# Patient Record
Sex: Female | Born: 1990 | Race: White | Hispanic: No | Marital: Single | Smoking: Current every day smoker
Health system: Southern US, Community
[De-identification: ages and names within clinical notes are randomized; demographics above are authoritative.]

## PROBLEM LIST (undated history)

## (undated) ENCOUNTER — Inpatient Hospital Stay (HOSPITAL_COMMUNITY): Payer: Self-pay

## (undated) DIAGNOSIS — I10 Essential (primary) hypertension: Secondary | ICD-10-CM

## (undated) DIAGNOSIS — F419 Anxiety disorder, unspecified: Secondary | ICD-10-CM

## (undated) DIAGNOSIS — F32A Depression, unspecified: Secondary | ICD-10-CM

## (undated) DIAGNOSIS — F329 Major depressive disorder, single episode, unspecified: Secondary | ICD-10-CM

## (undated) DIAGNOSIS — L732 Hidradenitis suppurativa: Secondary | ICD-10-CM

## (undated) HISTORY — PX: TONSILLECTOMY: SUR1361

## (undated) HISTORY — DX: Hidradenitis suppurativa: L73.2

## (undated) HISTORY — PX: URETERAL EXPLORATION: SHX2609

## (undated) HISTORY — PX: OTHER SURGICAL HISTORY: SHX169

---
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
## 2009-03-22 ENCOUNTER — Encounter: Admission: RE | Admit: 2009-03-22 | Discharge: 2009-03-22 | Payer: Self-pay | Admitting: Internal Medicine

## 2009-07-06 ENCOUNTER — Emergency Department: Payer: Self-pay | Admitting: Emergency Medicine

## 2009-12-09 ENCOUNTER — Emergency Department: Payer: Self-pay | Admitting: Emergency Medicine

## 2010-01-30 ENCOUNTER — Ambulatory Visit: Payer: Self-pay | Admitting: Obstetrics & Gynecology

## 2010-01-30 LAB — CONVERTED CEMR LAB
Chlamydia, Swab/Urine, PCR: NEGATIVE
GC Probe Amp, Urine: NEGATIVE
HCV Ab: NEGATIVE
Testosterone: 27.13 ng/dL (ref 15–40)

## 2010-02-19 ENCOUNTER — Ambulatory Visit: Payer: Self-pay | Admitting: Obstetrics & Gynecology

## 2010-10-08 NOTE — Assessment & Plan Note (Signed)
NAME:  Sara Vargas, Sara Vargas NO.:  [id number]   MEDICAL RECORD NO.:  [id number]          PATIENT TYPE:  POB   LOCATION:  [facility] at [facility]         FACILITY:  [facility]   PHYSICIAN:  Jaynie Collins, MD     DATE OF BIRTH:  March 02, 1991   DATE OF SERVICE:  01/30/2010                                  CLINIC NOTE   REASON FOR VISIT:  The patient is requesting sexually transmitted  infection screening and birth control options.   HISTORY OF PRESENT ILLNESS:  The patient is a 20 year old gravida 0 with  a history of hypertension, who is here today requesting sexually  transmitted infection testing and also to discuss birth control options.  The patient is sexually active and has had 3 partners within the last  year.  She does not know of any exposure to any sexually transmitted  infections but she has had a history of gonorrhea in the past and just  wants to make sure that everything is okay.  The patient also has a  history of irregular menstrual periods and has been irregular since  menarche and has been told that she has ovarian cyst, but otherwise no  gynecologic problems.  She uses condoms for birth control currently and  wants to discuss being on hormonal options.  She has no other  gynecologic concerns.   PAST GYNECOLOGIC HISTORY:  She has never had a Pap smear.  She has been  told she had an ovarian cyst in the past.  She has had gonorrhea in the  past that was treated.  The patient does have irregular menstrual  periods and is using condoms for contraception.   PAST MEDICAL HISTORY:  High blood pressure.   PAST SURGICAL HISTORY:  None.   MEDICATIONS:  Norvasc 5 mg daily.   ALLERGIES:  No known drug allergies.   SOCIAL HISTORY:  Patient works as a Lawyer.  She lives with her 2  roommates.  She smokes 1-pack a day and has smoked for 4 years.  She  drinks alcohol socially and does not take any drugs.   FAMILY HISTORY:  Significant for high blood pressure and heart  disease.   PHYSICAL EXAMINATION:  Deferred at this point, she has normal vital  signs as written in her physical chart and a blood pressure of 131/76,  weight 313 pounds, height 5 feet 5 inches.   ASSESSMENT AND PLAN:  The patient is a 20 year old morbidly obese smoker  who is here to discuss birth control options.  Given all her risk  factors, the patient was counseled regarding progestin only pill options  and emphasize Mirena IUD or the Depo-Provera.  The patient does not want  these options, but wants to be on a combined oral contraceptive pill.  She has used Yasmin in the past and wants a prescription for this.  Patient was told that there is an increased risk of stroke, blood clots,  and even death given her risk factors especially of morbid obesity and  smoking and her hypertension, but the patient wants to proceed with this  modality.  She was given a prescription for Yasmin and told to follow up  for any untoward symptoms of side effects as further workup for her  irregular menses, TSH, and a total testosterone will be checked, also a  pelvic ultrasound will also be checked.  She might also have polycystic  ovarian syndrome.  The patient does have a primary care physician and  hopefully would have been tested for diabetes, but we will follow this  up at her next visit.  As for her request, this test for sexually  transmitted infection, we get a urine, gonorrhea and Chlamydia.  The  patient also requested for pregnancy test which will be done for her  urine and a serum testing for hepatitis B and C, HIV.  At her next  visit, the patient will be reminded of the Gardasil vaccination, it is  likely she got this through her primary care physician, but we will  verify.  The patient was told to call or come back in for any further  gynecologic complaints.           ______________________________  Jaynie Collins, MD     UA/MEDQ  D:  01/30/2010  T:  01/30/2010  Job:  [id number]

## 2010-10-11 NOTE — Op Note (Signed)
NAMEMARGRET, MOAT               ACCOUNT NO.:  [id number]   MEDICAL RECORD NO.:  [id number]          PATIENT TYPE:  AMB   LOCATION:  SDS                          FACILITY:  [facility]   PHYSICIAN:  Doralee Albino. Carola Frost, M.D. DATE OF BIRTH:  09-17-1990   DATE OF PROCEDURE:  06/10/2005  DATE OF DISCHARGE:                                 OPERATIVE REPORT   ADDENDUM:  That dictation number is [phone].  I had an inadvertent  discontinuation at a continuing dictation.   We were able to obtain an excellent purchase in both the distal and the  proximal fragments. C-arm images confirmed anatomic reduction with  appropriate placement of hardware. The elbow could be taken through a full  range of motion without difficulty. The wounds were irrigated and closed in  a standard layered fashion using #0 Vicryl for the deep layer and triceps  repair; as well as a 2-0 Vicryl for the subcu, and staples for the skin.  Sterile gentle compressive dressing was applied, as well as a posterior and  lateral support splint.  She was then placed back into her sling and taken  to PACU in stable condition.   PROGNOSIS:  Desia should do quite well from this fracture repair given the  bony stability that we were able to achieve. She is at some risk for  paresthesias or neurapraxia, given the need to mobilize both the ulnar and  radial nerves during the procedure, although we were able to visualize the  nerve in its entirety throughout; and were able to see that it remained free  of injury. She will not require any formal DVT prophylaxis given her upper  extremity injury; however, because of her size and the possibility she may  be sedentary, I will ask her to take a baby aspirin daily. She will be  admitted overnight for pain control, and then discharged in the morning.      Doralee Albino. Carola Frost, M.D.  Electronically Signed     MHH/MEDQ  D:  06/10/2005  T:  06/10/2005  Job:  [id number]
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
## 2010-10-15 ENCOUNTER — Ambulatory Visit (INDEPENDENT_AMBULATORY_CARE_PROVIDER_SITE_OTHER): Payer: Private Health Insurance - Indemnity | Admitting: Obstetrics & Gynecology

## 2010-10-15 DIAGNOSIS — E282 Polycystic ovarian syndrome: Secondary | ICD-10-CM

## 2010-10-17 NOTE — Assessment & Plan Note (Signed)
NAME:  Sara Vargas, Sara Vargas NO.:  [id number]  MEDICAL RECORD NO.:  [id number]          PATIENT TYPE:  LOCATION:  [facility] at [facility]           FACILITY:  PHYSICIAN:  Allie Bossier, MD        DATE OF BIRTH:  [birth year]  DATE OF SERVICE:  10/15/2010                                 CLINIC NOTE  [name] is a 20 year old single, white, gravida 0, who is having unprotected intercourse.  She comes in today for complaints of a "boil" on her mons.  She says this has been in for at least a week and getting larger.  She says she typically gets these boils in her vulvar area. They usually go away in less than a week and this one is not.  Of note, she tells me that she continues to have irregular/almost no.  When she was seen by Dr. Macon Large in September 2011, she was given a prescription for Yasmin per the patient request, although Dr. Macon Large wrote that she preferred a progestogen-only methods (as would I).  However, the patient did not take the Yasmin and she uses condoms on an irregular basis, because of this I did a pregnancy test today and she said "I think I might be pregnant."  Her pregnancy test today is negative.  1. 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 b.i.d. #20. 2. Next issue is birth control.  I have given her handout on Mirena     and strongly recommended that she use the Mirena not only for birth     control, but also to help prevent uterine hyperplasia/uterine     cancer due to her history of oligomenorrhea. 3. Due to her morbid obesity and probable PCOS,  I am having her come     back next week for fasting blood (lipids and sugar).     Allie Bossier, MD    MCD/MEDQ  D:  10/15/2010  T:  10/16/2010  Job:  [id number]

## 2014-06-24 ENCOUNTER — Encounter (HOSPITAL_COMMUNITY): Payer: Self-pay | Admitting: Cardiology

## 2014-06-24 ENCOUNTER — Emergency Department (HOSPITAL_COMMUNITY): Payer: Private Health Insurance - Indemnity

## 2014-06-24 ENCOUNTER — Emergency Department (HOSPITAL_COMMUNITY)
Admission: EM | Admit: 2014-06-24 | Discharge: 2014-06-24 | Disposition: A | Discharge status: Home / Self Care | Payer: Private Health Insurance - Indemnity | Attending: Emergency Medicine | Admitting: Emergency Medicine

## 2014-06-24 DIAGNOSIS — Z79899 Other long term (current) drug therapy: Secondary | ICD-10-CM | POA: Insufficient documentation

## 2014-06-24 DIAGNOSIS — R079 Chest pain, unspecified: Secondary | ICD-10-CM

## 2014-06-24 DIAGNOSIS — I10 Essential (primary) hypertension: Secondary | ICD-10-CM | POA: Insufficient documentation

## 2014-06-24 DIAGNOSIS — Z720 Tobacco use: Secondary | ICD-10-CM | POA: Insufficient documentation

## 2014-06-24 HISTORY — DX: Essential (primary) hypertension: I10

## 2014-06-24 LAB — CBC WITH DIFFERENTIAL/PLATELET
Basophils Absolute: 0 10*3/uL (ref 0.0–0.1)
Basophils Relative: 0 % (ref 0–1)
EOS PCT: 1 % (ref 0–5)
Eosinophils Absolute: 0.1 10*3/uL (ref 0.0–0.7)
HEMATOCRIT: 41.8 % (ref 36.0–46.0)
HEMOGLOBIN: 14 g/dL (ref 12.0–15.0)
LYMPHS ABS: 2.6 10*3/uL (ref 0.7–4.0)
LYMPHS PCT: 38 % (ref 12–46)
MCH: 28.9 pg (ref 26.0–34.0)
MCHC: 33.5 g/dL (ref 30.0–36.0)
MCV: 86.2 fL (ref 78.0–100.0)
MONO ABS: 0.5 10*3/uL (ref 0.1–1.0)
MONOS PCT: 7 % (ref 3–12)
NEUTROS ABS: 3.7 10*3/uL (ref 1.7–7.7)
NEUTROS PCT: 54 % (ref 43–77)
PLATELETS: 241 10*3/uL (ref 150–400)
RBC: 4.85 MIL/uL (ref 3.87–5.11)
RDW: 13.3 % (ref 11.5–15.5)
WBC: 6.9 10*3/uL (ref 4.0–10.5)

## 2014-06-24 LAB — COMPREHENSIVE METABOLIC PANEL
ALBUMIN: 3.8 g/dL (ref 3.5–5.2)
ALT: 18 U/L (ref 0–35)
ANION GAP: 5 (ref 5–15)
AST: 18 U/L (ref 0–37)
Alkaline Phosphatase: 92 U/L (ref 39–117)
BILIRUBIN TOTAL: 1.2 mg/dL (ref 0.3–1.2)
BUN: 9 mg/dL (ref 6–23)
CALCIUM: 9.2 mg/dL (ref 8.4–10.5)
CHLORIDE: 111 mmol/L (ref 96–112)
CO2: 27 mmol/L (ref 19–32)
Creatinine, Ser: 0.88 mg/dL (ref 0.50–1.10)
Glucose, Bld: 86 mg/dL (ref 70–99)
Potassium: 3.5 mmol/L (ref 3.5–5.1)
Sodium: 143 mmol/L (ref 135–145)
Total Protein: 6.3 g/dL (ref 6.0–8.3)

## 2014-06-24 LAB — I-STAT TROPONIN, ED: TROPONIN I, POC: 0 ng/mL (ref 0.00–0.08)

## 2014-06-24 MED ORDER — ALBUTEROL SULFATE (2.5 MG/3ML) 0.083% IN NEBU
5.0000 mg | INHALATION_SOLUTION | Freq: Once | RESPIRATORY_TRACT | Status: AC
  Administered 2014-06-24: 5 mg via RESPIRATORY_TRACT
  Filled 2014-06-24: qty 6

## 2014-06-24 NOTE — ED Notes (Signed)
Patient was not in room when I went to round on patient.  Monitoring equipment not in place.  Looked around department for patient. Unable to locate.  Family member has left as well, assumed patient has left AMA.

## 2014-06-24 NOTE — ED Notes (Signed)
Pt reports chest pain and HTN for the past couple of days. States that she has a hx of HTN and has not been taking her medications. Reports some SOB.

## 2014-06-24 NOTE — ED Notes (Signed)
Dr. Bebe ShaggyWickline informed that patient has left AMA.

## 2014-06-24 NOTE — ED Notes (Signed)
Anju, PA at bedside  

## 2014-06-24 NOTE — ED Provider Notes (Signed)
CSN: [id number]     Arrival date & time 06/24/14  1446 History   First MD Initiated Contact with Patient 06/24/14 1549     Chief Complaint  Patient presents with  . Chest Pain     (Consider location/radiation/quality/duration/timing/severity/associated sxs/prior Treatment) Patient is a 24 y.o. female presenting with chest pain. The history is provided by the patient and medical records. No language interpreter was used.  Chest Pain Associated symptoms: no abdominal pain, no back pain, no cough, no diaphoresis, no fatigue, no fever, no headache, no nausea, no shortness of breath and not vomiting     Sara Vargas is a 24 y.o. female  with a hx of HTN, bronchospasm with intermittent albuterol inhaler usage presents to the Emergency Department complaining of gradual, persistent, progressively improving left chest "tightness" onset 1pm.  Pt without personal cardiac Hx; Mother with hx of CAD.  Pt describes chest pain as left-sided chest tightness similar to previous bronchospasm episodes. She has not tried any treatments including her inhaler prior to arrival. She denies shortness of breath, nausea, diaphoresis, abdominal pain, vomiting, weakness, syncope, near syncope, dysuria, hematuria. Patient reports that she is supposed to be taking Norvasc for her hypertension has not taken it in approximately 3 months due to financial concerns. She reports that while lifting and pulling at work today she began to feel the left-sided chest tightness and slightly short of breath. With her coworker checked her blood pressure she reports it was 160 systolic. She reports this is what prompted her to come to the emergency department. She reports that this time she feels much better, her chest pain is decreasing and her shortness of breath has resolved however the chest tightness persists. No aggravating or alleviating factors. Movement and palpation do not make her discomfort worse.     Past Medical History   Diagnosis Date  . Hypertension    Past Surgical History  Procedure Laterality Date  . Tonsillectomy    . Arm surgery     History reviewed. No pertinent family history. History  Substance Use Topics  . Smoking status: Current Every Day Smoker -- 1.00 packs/day    Types: Cigarettes  . Smokeless tobacco: Not on file  . Alcohol Use: No   OB History    No data available     Review of Systems  Constitutional: Negative for fever, diaphoresis, appetite change, fatigue and unexpected weight change.  HENT: Negative for mouth sores.   Eyes: Negative for visual disturbance.  Respiratory: Positive for chest tightness. Negative for cough, shortness of breath and wheezing.   Cardiovascular: Positive for chest pain.  Gastrointestinal: Negative for nausea, vomiting, abdominal pain, diarrhea and constipation.  Endocrine: Negative for polydipsia, polyphagia and polyuria.  Genitourinary: Negative for dysuria, urgency, frequency and hematuria.  Musculoskeletal: Negative for back pain and neck stiffness.  Skin: Negative for rash.  Allergic/Immunologic: Negative for immunocompromised state.  Neurological: Negative for syncope, light-headedness and headaches.  Hematological: Does not bruise/bleed easily.  Psychiatric/Behavioral: Negative for sleep disturbance. The patient is not nervous/anxious.       Allergies  Review of patient's allergies indicates no known allergies.  Home Medications   Prior to Admission medications   Not on File   BP 105/43 mmHg  Pulse 82  Temp(Src) 97.7 F (36.5 C) (Oral)  Resp 22  Ht  (1.651 m)  Wt 314 lb (142.429 kg)  BMI 52.25 kg/m2  SpO2 98%  LMP 06/03/2014 Physical Exam  Constitutional: She appears well-developed and well-nourished.  No distress.  Awake, alert, nontoxic appearance  HENT:  Head: Normocephalic and atraumatic.  Mouth/Throat: Oropharynx is clear and moist. No oropharyngeal exudate.  Eyes: Conjunctivae are normal. No scleral  icterus.  Neck: Normal range of motion. Neck supple.  Cardiovascular: Normal rate, regular rhythm, normal heart sounds and intact distal pulses.   No murmur heard. No tachycardia  Pulmonary/Chest: Effort normal. No accessory muscle usage. No tachypnea. No respiratory distress. She has decreased breath sounds (throughout). She has no wheezes. She has no rhonchi. She has no rales. She exhibits no tenderness.  Equal chest expansion  Abdominal: Soft. Bowel sounds are normal. She exhibits no mass. There is no tenderness. There is no rebound and no guarding.  Musculoskeletal: Normal range of motion. She exhibits no edema.  Neurological: She is alert. She exhibits normal muscle tone. Coordination normal.  Speech is clear and goal oriented Moves extremities without ataxia  Skin: Skin is warm and dry. She is not diaphoretic. No erythema.  Psychiatric: She has a normal mood and affect.  Nursing note and vitals reviewed.   ED Course  Procedures (including critical care time) Labs Review Labs Reviewed  CBC WITH DIFFERENTIAL/PLATELET  COMPREHENSIVE METABOLIC PANEL  URINALYSIS, ROUTINE W REFLEX MICROSCOPIC  I-STAT TROPOININ, ED  POC URINE PREG, ED    Imaging Review Dg Chest 2 View  06/24/2014   CLINICAL DATA:  Chest pain and difficulty breathing  EXAM: CHEST  2 VIEW  COMPARISON:  August 21, 2009  FINDINGS: Lungs are clear. Heart size and pulmonary vascularity are normal. No adenopathy. No pneumothorax. No bone lesions.  IMPRESSION: No edema or consolidation.   Electronically Signed   By: Bretta Bang M.D.   On: 06/24/2014 17:09     EKG Interpretation   Date/Time:  Saturday June 24 2014 14:52:25 EST Ventricular Rate:  96 PR Interval:  116 QRS Duration: 90 QT Interval:  332 QTC Calculation: 419 R Axis:   66 Text Interpretation:  Normal sinus rhythm ST abnormality, possible  digitalis effect Abnormal ECG No significant change since last tracing  Confirmed by Bebe Shaggy  MD, DONALD  [phone]) on 06/24/2014 4:13:29 PM      MDM   Final diagnoses:  Chest pain   [name] presents with chest pain and concerns for HTN.  Pt is not hypertensive here in the ED. she complains of chest tightness similar to previous issues with bronchospasm, no wheezing heard. Will give albuterol and reassess. Labs and chest x-ray pending.   5:59 PM Patient is to be discharged with recommendation to follow up with PCP in regards to today's hospital visit. Chest pain is not likely of cardiac or pulmonary etiology d/t presentation, PERC negative, VSS, no tracheal deviation, no JVD or new murmur, RRR, breath sounds equal bilaterally, EKG without acute abnormalities, negative troponin, and negative CXR. Patient is chest pain-free at this time. She reports that the albuterol helped a little bit as her chest does not feel as tight. Patient requests discharge home at this time. Pt has been advised to return to the ED if CP becomes exertional, associated with diaphoresis or nausea, radiates to left jaw/arm, worsens or becomes concerning in any way. Pt appears reliable for follow up and is agreeable to discharge.   I have personally reviewed patient's vitals, nursing note and any pertinent labs or imaging.  I performed an undressed physical exam.    It has been determined that no acute conditions requiring further emergency intervention are present at this time. The patient/guardian  have been advised of the diagnosis and plan. I reviewed all labs and imaging including any potential incidental findings. We have discussed signs and symptoms that warrant return to the ED and they are listed in the discharge instructions.    Vital signs are stable at discharge.   BP 105/43 mmHg  Pulse 82  Temp(Src) 97.7 F (36.5 C) (Oral)  Resp 22  Ht 5\' 5"  (1.651 m)  Wt 314 lb (142.429 kg)  BMI 52.25 kg/m2  SpO2 98%  LMP 06/03/2014           Dierdre ForthHannah Heaton Sarin, PA-C 06/24/14 1826  Joya Gaskinsonald W Wickline,  MD 06/24/14 [phone]

## 2014-06-24 NOTE — Discharge Instructions (Signed)
1. Medications: usual home medications 2. Treatment: rest, drink plenty of fluids,  3. Follow Up: [facility] for discussion of your diagnoses and further evaluation after today's visit; if you do not have a primary care doctor use the resource guide provided to find one; Please return to the ER for return of CP, SOB, passing out or other symptoms.   Chest Pain (Nonspecific) It is often hard to give a diagnosis for the cause of chest pain. There is always a chance that your pain could be related to something serious, such as a heart attack or a blood clot in the lungs. You need to follow up with your doctor. HOME CARE  If antibiotic medicine was given, take it as directed by your doctor. Finish the medicine even if you start to feel better.  For the next few days, avoid activities that bring on chest pain. Continue physical activities as told by your doctor.  Do not use any tobacco products. This includes cigarettes, chewing tobacco, and e-cigarettes.  Avoid drinking alcohol.  Only take medicine as told by your doctor.  Follow your doctor's suggestions for more testing if your chest pain does not go away.  Keep all doctor visits you made. GET HELP IF:  Your chest pain does not go away, even after treatment.  You have a rash with blisters on your chest.  You have a fever. GET HELP RIGHT AWAY IF:   You have more pain or pain that spreads to your arm, neck, jaw, back, or belly (abdomen).  You have shortness of breath.  You cough more than usual or cough up blood.  You have very bad back or belly pain.  You feel sick to your stomach (nauseous) or throw up (vomit).  You have very bad weakness.  You pass out (faint).  You have chills. This is an emergency. Do not wait to see if the problems will go away. Call your local emergency services (911 in [place] Do not drive yourself to the hospital. MAKE SURE YOU:   Understand these  instructions.  Will watch your condition.  Will get help right away if you are not doing well or get worse. Document Released: 10/29/2007 Document Revised: 05/17/2013 Document Reviewed: 10/29/2007 [facility] Patient Information 2015 [employer] [place]. This information is not intended to replace advice given to you by your health care provider. Make sure you discuss any questions you have with your health care provider.

## 2014-08-14 ENCOUNTER — Encounter (HOSPITAL_COMMUNITY): Payer: Self-pay | Admitting: Emergency Medicine

## 2014-08-14 ENCOUNTER — Emergency Department (HOSPITAL_COMMUNITY)
Admission: EM | Admit: 2014-08-14 | Discharge: 2014-08-14 | Disposition: A | Discharge status: Home / Self Care | Payer: Private Health Insurance - Indemnity | Attending: Emergency Medicine | Admitting: Emergency Medicine

## 2014-08-14 DIAGNOSIS — N390 Urinary tract infection, site not specified: Secondary | ICD-10-CM | POA: Insufficient documentation

## 2014-08-14 DIAGNOSIS — Z720 Tobacco use: Secondary | ICD-10-CM | POA: Insufficient documentation

## 2014-08-14 DIAGNOSIS — I10 Essential (primary) hypertension: Secondary | ICD-10-CM | POA: Insufficient documentation

## 2014-08-14 LAB — URINE MICROSCOPIC-ADD ON

## 2014-08-14 LAB — URINALYSIS, ROUTINE W REFLEX MICROSCOPIC
Bilirubin Urine: NEGATIVE
GLUCOSE, UA: NEGATIVE mg/dL
KETONES UR: NEGATIVE mg/dL
NITRITE: POSITIVE — AB
PROTEIN: NEGATIVE mg/dL
Specific Gravity, Urine: 1.006 (ref 1.005–1.030)
UROBILINOGEN UA: 1 mg/dL (ref 0.0–1.0)
pH: 5.5 (ref 5.0–8.0)

## 2014-08-14 MED ORDER — HYDROCODONE-ACETAMINOPHEN 5-325 MG PO TABS: 1.0000 | ORAL_TABLET | ORAL | Status: DC | PRN

## 2014-08-14 MED ORDER — CEPHALEXIN 500 MG PO CAPS: 500.0000 mg | ORAL_CAPSULE | Freq: Four times a day (QID) | ORAL | Status: DC

## 2014-08-14 NOTE — ED Notes (Signed)
Pt states having lower back pain, diagnosed w/ lower UTI, to give discharge papers

## 2014-08-14 NOTE — ED Provider Notes (Signed)
CSN: [id number]     Arrival date & time 08/14/14  1653 History   First MD Initiated Contact with Patient 08/14/14 2013     Chief Complaint  Patient presents with  . Dysuria     (Consider location/radiation/quality/duration/timing/severity/associated sxs/prior Treatment) HPI Comments: Patient here complaining of dysuria without vaginal bleeding or discharge and some right-sided flank pain 2 days. She had a negative pregnancy test 2 days ago according to her as well. Has been using over-the-counter medications without relief. History of UTI in the past and this is similar. Denies any vomiting or diarrhea. No rashes to her flank area. Nothing makes her symptoms better  Patient is a 24 y.o. female presenting with dysuria. The history is provided by the patient.  Dysuria   Past Medical History  Diagnosis Date  . Hypertension    Past Surgical History  Procedure Laterality Date  . Tonsillectomy    . Arm surgery    . Ureteral exploration     No family history on file. History  Substance Use Topics  . Smoking status: Current Every Day Smoker -- 1.00 packs/day    Types: Cigarettes  . Smokeless tobacco: Not on file  . Alcohol Use: No   OB History    No data available     Review of Systems  Genitourinary: Positive for dysuria.  All other systems reviewed and are negative.     Allergies  Review of patient's allergies indicates no known allergies.  Home Medications   Prior to Admission medications   Medication Sig Start Date End Date Taking? Authorizing Provider  cephALEXin (KEFLEX) 500 MG capsule Take 1 capsule (500 mg total) by mouth 4 (four) times daily. 08/14/14   Lorre NickAnthony Ercell Perlman, MD  HYDROcodone-acetaminophen (NORCO/VICODIN) 5-325 MG per tablet Take 1-2 tablets by mouth every 4 (four) hours as needed. 08/14/14   Lorre NickAnthony Miyana Mordecai, MD   BP 145/73 mmHg  Pulse 89  Temp(Src) 98.2 F (36.8 C) (Oral)  Resp 18  SpO2 100%  LMP 07/08/2014 Physical Exam  Constitutional: She is  oriented to person, place, and time. She appears well-developed and well-nourished.  Non-toxic appearance. No distress.  HENT:  Head: Normocephalic and atraumatic.  Eyes: Conjunctivae, EOM and lids are normal. Pupils are equal, round, and reactive to light.  Neck: Normal range of motion. Neck supple. No tracheal deviation present. No thyroid mass present.  Cardiovascular: Normal rate, regular rhythm and normal heart sounds.  Exam reveals no gallop.   No murmur heard. Pulmonary/Chest: Effort normal and breath sounds normal. No stridor. No respiratory distress. She has no decreased breath sounds. She has no wheezes. She has no rhonchi. She has no rales.  Abdominal: Soft. Normal appearance and bowel sounds are normal. She exhibits no distension. There is tenderness. There is no rigidity, no rebound, no guarding and no CVA tenderness.  Musculoskeletal: Normal range of motion. She exhibits no edema or tenderness.  Neurological: She is alert and oriented to person, place, and time. She has normal strength. No cranial nerve deficit or sensory deficit. GCS eye subscore is 4. GCS verbal subscore is 5. GCS motor subscore is 6.  Skin: Skin is warm and dry. No abrasion and no rash noted.  Psychiatric: She has a normal mood and affect. Her speech is normal and behavior is normal.  Nursing note and vitals reviewed.   ED Course  Procedures (including critical care time) Labs Review Labs Reviewed  URINALYSIS, ROUTINE W REFLEX MICROSCOPIC - Abnormal; Notable for the following:  Color, Urine ORANGE (*)    APPearance CLOUDY (*)    Hgb urine dipstick MODERATE (*)    Nitrite POSITIVE (*)    Leukocytes, UA MODERATE (*)    All other components within normal limits  URINE MICROSCOPIC-ADD ON - Abnormal; Notable for the following:    Squamous Epithelial / LPF FEW (*)    Bacteria, UA MANY (*)    All other components within normal limits    Imaging Review No results found.   EKG Interpretation None       MDM   Final diagnoses:  UTI (lower urinary tract infection)    Patient to be treated for UTI    Lorre Nick, MD 08/14/14 2024

## 2014-08-14 NOTE — ED Notes (Signed)
Pt states she has had painful urination and R sided back pain since Saturday. Alert and oriented.

## 2014-08-14 NOTE — Discharge Instructions (Signed)

## 2015-01-08 ENCOUNTER — Other Ambulatory Visit: Payer: Self-pay | Admitting: Obstetrics and Gynecology

## 2015-01-08 DIAGNOSIS — O3680X0 Pregnancy with inconclusive fetal viability, not applicable or unspecified: Secondary | ICD-10-CM

## 2015-01-09 ENCOUNTER — Other Ambulatory Visit: Payer: Self-pay | Admitting: Obstetrics and Gynecology

## 2015-01-09 ENCOUNTER — Ambulatory Visit (INDEPENDENT_AMBULATORY_CARE_PROVIDER_SITE_OTHER): Payer: Medicaid Other

## 2015-01-09 DIAGNOSIS — O3680X0 Pregnancy with inconclusive fetal viability, not applicable or unspecified: Secondary | ICD-10-CM

## 2015-01-09 NOTE — Progress Notes (Signed)
[place] 6+1wks single IUP w/ys,pos fht 116bpm,normal ov's bilat,crl 4.23mm,edd 09/03/2015

## 2015-01-18 ENCOUNTER — Ambulatory Visit (INDEPENDENT_AMBULATORY_CARE_PROVIDER_SITE_OTHER): Payer: Medicaid Other | Admitting: Advanced Practice Midwife

## 2015-01-18 ENCOUNTER — Encounter: Payer: Self-pay | Admitting: Advanced Practice Midwife

## 2015-01-18 VITALS — BP 108/72 | HR 64 | Wt 323.0 lb

## 2015-01-18 DIAGNOSIS — O10919 Unspecified pre-existing hypertension complicating pregnancy, unspecified trimester: Secondary | ICD-10-CM | POA: Insufficient documentation

## 2015-01-18 DIAGNOSIS — Z0283 Encounter for blood-alcohol and blood-drug test: Secondary | ICD-10-CM

## 2015-01-18 DIAGNOSIS — Z3401 Encounter for supervision of normal first pregnancy, first trimester: Secondary | ICD-10-CM | POA: Diagnosis not present

## 2015-01-18 DIAGNOSIS — Z331 Pregnant state, incidental: Secondary | ICD-10-CM | POA: Diagnosis not present

## 2015-01-18 DIAGNOSIS — Z6841 Body Mass Index (BMI) 40.0 and over, adult: Secondary | ICD-10-CM

## 2015-01-18 DIAGNOSIS — O09891 Supervision of other high risk pregnancies, first trimester: Secondary | ICD-10-CM | POA: Diagnosis not present

## 2015-01-18 DIAGNOSIS — O09899 Supervision of other high risk pregnancies, unspecified trimester: Secondary | ICD-10-CM | POA: Insufficient documentation

## 2015-01-18 DIAGNOSIS — Z369 Encounter for antenatal screening, unspecified: Secondary | ICD-10-CM

## 2015-01-18 DIAGNOSIS — Z3682 Encounter for antenatal screening for nuchal translucency: Secondary | ICD-10-CM

## 2015-01-18 DIAGNOSIS — O10911 Unspecified pre-existing hypertension complicating pregnancy, first trimester: Secondary | ICD-10-CM

## 2015-01-18 DIAGNOSIS — Z1389 Encounter for screening for other disorder: Secondary | ICD-10-CM | POA: Diagnosis not present

## 2015-01-18 NOTE — Patient Instructions (Addendum)
Safe Medications in Pregnancy   Acne: Benzoyl Peroxide Salicylic Acid  Backache/Headache: Tylenol: 2 regular strength every 4 hours OR              2 Extra strength every 6 hours  Colds/Coughs/Allergies: Benadryl (alcohol free) 25 mg every 6 hours as needed Breath right strips Claritin Cepacol throat lozenges Chloraseptic throat spray Cold-Eeze- up to three times per day Cough drops, alcohol free Flonase (by prescription only) Guaifenesin Mucinex Robitussin DM (plain only, alcohol free) Saline nasal spray/drops Sudafed (pseudoephedrine) & Actifed ** use only after [redacted] weeks gestation and if you do not have high blood pressure Tylenol Vicks Vaporub Zinc lozenges Zyrtec   Constipation: Colace Ducolax suppositories Fleet enema Glycerin suppositories Metamucil Milk of magnesia Miralax Senokot Smooth move tea  Diarrhea: Kaopectate Imodium A-D  *NO pepto Bismol  Hemorrhoids: Anusol Anusol HC Preparation H Tucks  Indigestion: Tums Maalox Mylanta Zantac  Pepcid  Insomnia: Benadryl (alcohol free) 25mg every 6 hours as needed Tylenol PM Unisom, no Gelcaps  Leg Cramps: Tums MagGel  Nausea/Vomiting:  Bonine Dramamine Emetrol Ginger extract Sea bands Meclizine  Nausea medication to take during pregnancy:  Unisom (doxylamine succinate 25 mg tablets) Take one tablet daily at bedtime. If symptoms are not adequately controlled, the dose can be increased to a maximum recommended dose of two tablets daily (1/2 tablet in the morning, 1/2 tablet mid-afternoon and one at bedtime). Vitamin B6 100mg tablets. Take one tablet twice a day (up to 200 mg per day).  Skin Rashes: Aveeno products Benadryl cream or 25mg every 6 hours as needed Calamine Lotion 1% cortisone cream  Yeast infection: Gyne-lotrimin 7 Monistat 7   **If taking multiple medications, please check labels to avoid duplicating the same active ingredients **take medication as directed on  the label ** Do not exceed 4000 mg of tylenol in 24 hours **Do not take medications that contain aspirin or ibuprofen    First Trimester of Pregnancy The first trimester of pregnancy is from week 1 until the end of week 12 (months 1 through 3). A week after a sperm fertilizes an egg, the egg will implant on the wall of the uterus. This embryo will begin to develop into a baby. Genes from you and your partner are forming the baby. The female genes determine whether the baby is a boy or a girl. At 6-8 weeks, the eyes and face are formed, and the heartbeat can be seen on ultrasound. At the end of 12 weeks, all the baby's organs are formed.  Now that you are pregnant, you will want to do everything you can to have a healthy baby. Two of the most important things are to get good prenatal care and to follow your health care provider's instructions. Prenatal care is all the medical care you receive before the baby's birth. This care will help prevent, find, and treat any problems during the pregnancy and childbirth. BODY CHANGES Your body goes through many changes during pregnancy. The changes vary from woman to woman.   You may gain or lose a couple of pounds at first.  You may feel sick to your stomach (nauseous) and throw up (vomit). If the vomiting is uncontrollable, call your health care provider.  You may tire easily.  You may develop headaches that can be relieved by medicines approved by your health care provider.  You may urinate more often. Painful urination may mean you have a bladder infection.  You may develop heartburn as a result of your   pregnancy.  You may develop constipation because certain hormones are causing the muscles that push waste through your intestines to slow down.  You may develop hemorrhoids or swollen, bulging veins (varicose veins).  Your breasts may begin to grow larger and become tender. Your nipples may stick out more, and the tissue that surrounds them (areola)  may become darker.  Your gums may bleed and may be sensitive to brushing and flossing.  Dark spots or blotches (chloasma, mask of pregnancy) may develop on your face. This will likely fade after the baby is born.  Your menstrual periods will stop.  You may have a loss of appetite.  You may develop cravings for certain kinds of food.  You may have changes in your emotions from day to day, such as being excited to be pregnant or being concerned that something may go wrong with the pregnancy and baby.  You may have more vivid and strange dreams.  You may have changes in your hair. These can include thickening of your hair, rapid growth, and changes in texture. Some women also have hair loss during or after pregnancy, or hair that feels dry or thin. Your hair will most likely return to normal after your baby is born. WHAT TO EXPECT AT YOUR PRENATAL VISITS During a routine prenatal visit:  You will be weighed to make sure you and the baby are growing normally.  Your blood pressure will be taken.  Your abdomen will be measured to track your baby's growth.  The fetal heartbeat will be listened to starting around week 10 or 12 of your pregnancy.  Test results from any previous visits will be discussed. Your health care provider may ask you:  How you are feeling.  If you are feeling the baby move.  If you have had any abnormal symptoms, such as leaking fluid, bleeding, severe headaches, or abdominal cramping.  If you have any questions. Other tests that may be performed during your first trimester include:  Blood tests to find your blood type and to check for the presence of any previous infections. They will also be used to check for low iron levels (anemia) and Rh antibodies. Later in the pregnancy, blood tests for diabetes will be done along with other tests if problems develop.  Urine tests to check for infections, diabetes, or protein in the urine.  An ultrasound to confirm  the proper growth and development of the baby.  An amniocentesis to check for possible genetic problems.  Fetal screens for spina bifida and Down syndrome.  You may need other tests to make sure you and the baby are doing well. HOME CARE INSTRUCTIONS  Medicines  Follow your health care provider's instructions regarding medicine use. Specific medicines may be either safe or unsafe to take during pregnancy.  Take your prenatal vitamins as directed.  If you develop constipation, try taking a stool softener if your health care provider approves. Diet  Eat regular, well-balanced meals. Choose a variety of foods, such as meat or vegetable-based protein, fish, milk and low-fat dairy products, vegetables, fruits, and whole grain breads and cereals. Your health care provider will help you determine the amount of weight gain that is right for you. Based on your current BMI, NO WEIGHT gain is recommended. Hopefully, by adjusting to a more healthy diet, you may, in fact, lose weight.   Avoid raw meat and uncooked cheese. These carry germs that can cause birth defects in the baby.  Eating four or five small  meals rather than three large meals a day may help relieve nausea and vomiting. If you start to feel nauseous, eating a few soda crackers can be helpful. Drinking liquids between meals instead of during meals also seems to help nausea and vomiting.  If you develop constipation, eat more high-fiber foods, such as fresh vegetables or fruit and whole grains. Drink enough fluids to keep your urine clear or pale yellow. Activity and Exercise  Exercise only as directed by your health care provider. Exercising will help you:  Control your weight.  Stay in shape.  Be prepared for labor and delivery.  Experiencing pain or cramping in the lower abdomen or low back is a good sign that you should stop exercising. Check with your health care provider before continuing normal exercises.  Try to avoid  standing for long periods of time. Move your legs often if you must stand in one place for a long time.  Avoid heavy lifting.  Wear low-heeled shoes, and practice good posture.  You may continue to have sex unless your health care provider directs you otherwise. Relief of Pain or Discomfort  Wear a good support bra for breast tenderness.   Take warm sitz baths to soothe any pain or discomfort caused by hemorrhoids. Use hemorrhoid cream if your health care provider approves.   Rest with your legs elevated if you have leg cramps or low back pain.  If you develop varicose veins in your legs, wear support hose. Elevate your feet for 15 minutes, 3-4 times a day. Limit salt in your diet. Prenatal Care  Schedule your prenatal visits by the twelfth week of pregnancy. They are usually scheduled monthly at first, then more often in the last 2 months before delivery.  Write down your questions. Take them to your prenatal visits.  Keep all your prenatal visits as directed by your health care provider. Safety  Wear your seat belt at all times when driving.  Make a list of emergency phone numbers, including numbers for family, friends, the hospital, and police and fire departments. General Tips  Ask your health care provider for a referral to a local prenatal education class. Begin classes no later than at the beginning of month 6 of your pregnancy.  Ask for help if you have counseling or nutritional needs during pregnancy. Your health care provider can offer advice or refer you to specialists for help with various needs.  Do not use hot tubs, steam rooms, or saunas.  Do not douche or use tampons or scented sanitary pads.  Do not cross your legs for long periods of time.  Avoid cat litter boxes and soil used by cats. These carry germs that can cause birth defects in the baby and possibly loss of the fetus by miscarriage or stillbirth.  Avoid all smoking, herbs, alcohol, and medicines  not prescribed by your health care provider. Chemicals in these affect the formation and growth of the baby.  Schedule a dentist appointment. At home, brush your teeth with a soft toothbrush and be gentle when you floss. SEEK MEDICAL CARE IF:   You have dizziness.  You have mild pelvic cramps, pelvic pressure, or nagging pain in the abdominal area.  You have persistent nausea, vomiting, or diarrhea.  You have a bad smelling vaginal discharge.  You have pain with urination.  You notice increased swelling in your face, hands, legs, or ankles. SEEK IMMEDIATE MEDICAL CARE IF:   You have a fever.  You are leaking fluid from  your vagina.  You have spotting or bleeding from your vagina.  You have severe abdominal cramping or pain.  You have rapid weight gain or loss.  You vomit blood or material that looks like coffee grounds.  You are exposed to [place] measles and have never had them.  You are exposed to fifth disease or chickenpox.  You develop a severe headache.  You have shortness of breath.  You have any kind of trauma, such as from a fall or a car accident. Document Released: 05/06/2001 Document Revised: 09/26/2013 Document Reviewed: 03/22/2013 [facility] Patient Information 2015 [place], [place]. This information is not intended to replace advice given to you by your health care provider. Make sure you discuss any questions you have with your health care provider.

## 2015-01-18 NOTE — Progress Notes (Signed)
[id number] Subjective:    Sara Vargas is a G1P0 [redacted]w[redacted]d being seen today for her first obstetrical visit.  Her obstetrical history is significant for first pregnancy.  She states she was told at some point that she had HTN, but has not been on meds  Patient reports nausea and crampin, hip pain, dizziness. Declines meds, only nauseated for a little while. Gets dizzy from time to time, particularly if she hasn't eaten or stands too long.  Filed Vitals:   01/18/15 1126  BP: 108/72  Pulse: 64  Weight: 323 lb (146.512 kg)    HISTORY: OB History  Gravida Para Term Preterm AB SAB TAB Ectopic Multiple Living  1             # Outcome Date GA Lbr Len/2nd Weight Sex Delivery Anes PTL Lv  1 Current              Past Medical History  Diagnosis Date  . Hypertension    Past Surgical History  Procedure Laterality Date  . Tonsillectomy    . Arm surgery    . Ureteral exploration     Family History  Problem Relation Age of Onset  . Heart disease Mother   . COPD Mother   . Asthma Mother   . Other Father     benign brain tumor  . Heart disease Maternal Grandmother      Exam       Pelvic Exam:    Perineum: Normal Perineum   Vulva: normal                   Urinary:  urethral meatus normal    System:     Skin: normal coloration and turgor, no rashes    Neurologic: oriented, normal, normal mood   Extremities: normal strength, tone, and muscle mass   HEENT PERRLA   Mouth/Teeth mucous membranes moist, normal  dentition   Neck supple and no masses   Cardiovascular: regular rate and rhythm   Respiratory:  appears well, vitals normal, no respiratory distress, acyanotic   Abdomen: soft, non-tender;  FHR: 160us          Assessment:    Pregnancy: G1P0 Patient Active Problem List   Diagnosis Date Noted  . Supervision of normal first pregnancy 01/18/2015  . Morbid obesity with BMI of 50.0-59.9, adult 01/18/2015  . ABNORMAL WEIGHT GAIN 04/13/2007        Plan:      Initial labs drawn. Continue prenatal vitamins  Problem list reviewed and updated  Reviewed n/v relief measures and warning s/s to report. Will offer diclegis if needs meds Reviewed recommended weight gain based on pre-gravid BMI --no weight gain recommended.  Encouraged well-balanced diet Genetic Screening discussed Integrated Screen: requested.  Ultrasound discussed; fetal survey: requested.  Follow up in 5 weeks for NT/IT.  CRESENZO-DISHMAN,Kaianna Dolezal 01/18/2015

## 2015-01-19 LAB — GC/CHLAMYDIA PROBE AMP
Chlamydia trachomatis, NAA: NEGATIVE
NEISSERIA GONORRHOEAE BY PCR: NEGATIVE

## 2015-01-20 LAB — URINE CULTURE

## 2015-01-30 LAB — PMP SCREEN PROFILE (10S), URINE
AMPHETAMINE SCRN UR: NEGATIVE ng/mL
BENZODIAZEPINE SCREEN, URINE: NEGATIVE ng/mL
Barbiturate Screen, Ur: NEGATIVE ng/mL
CANNABINOIDS UR QL SCN: NEGATIVE ng/mL
COCAINE(METAB.) SCREEN, URINE: NEGATIVE ng/mL
Creatinine(Crt), U: 36.2 mg/dL (ref 20.0–300.0)
Methadone Scn, Ur: NEGATIVE ng/mL
OPIATE SCRN UR: NEGATIVE ng/mL
OXYCODONE+OXYMORPHONE UR QL SCN: NEGATIVE ng/mL
PCP SCRN UR: NEGATIVE ng/mL
PH UR, DRUG SCRN: 7.4 (ref 4.5–8.9)
Propoxyphene, Screen: NEGATIVE ng/mL

## 2015-01-30 LAB — HIV ANTIBODY (ROUTINE TESTING W REFLEX): HIV Screen 4th Generation wRfx: NONREACTIVE

## 2015-01-30 LAB — URINALYSIS, ROUTINE W REFLEX MICROSCOPIC
BILIRUBIN UA: NEGATIVE
Glucose, UA: NEGATIVE
Ketones, UA: NEGATIVE
LEUKOCYTES UA: NEGATIVE
Nitrite, UA: NEGATIVE
PH UA: 7.5 (ref 5.0–7.5)
PROTEIN UA: NEGATIVE
RBC, UA: NEGATIVE
Specific Gravity, UA: 1.007 (ref 1.005–1.030)
Urobilinogen, Ur: 0.2 mg/dL (ref 0.2–1.0)

## 2015-01-30 LAB — CBC
Hematocrit: 42.7 % (ref 34.0–46.6)
Hemoglobin: 13.8 g/dL (ref 11.1–15.9)
MCH: 28.6 pg (ref 26.6–33.0)
MCHC: 32.3 g/dL (ref 31.5–35.7)
MCV: 89 fL (ref 79–97)
PLATELETS: 254 10*3/uL (ref 150–379)
RBC: 4.82 x10E6/uL (ref 3.77–5.28)
RDW: 13.7 % (ref 12.3–15.4)
WBC: 8.4 10*3/uL (ref 3.4–10.8)

## 2015-01-30 LAB — ABO/RH: RH TYPE: NEGATIVE

## 2015-01-30 LAB — RPR: RPR: NONREACTIVE

## 2015-01-30 LAB — CYSTIC FIBROSIS MUTATION 97: Interpretation: NOT DETECTED

## 2015-01-30 LAB — VARICELLA ZOSTER ANTIBODY, IGG

## 2015-01-30 LAB — HEPATITIS B SURFACE ANTIGEN: HEP B S AG: NEGATIVE

## 2015-01-30 LAB — RUBELLA SCREEN: Rubella Antibodies, IGG: 3.03 index (ref >0.99)

## 2015-01-30 LAB — ANTIBODY SCREEN: Antibody Screen: NEGATIVE

## 2015-01-31 ENCOUNTER — Telehealth: Payer: Self-pay | Admitting: Advanced Practice Midwife

## 2015-01-31 NOTE — Telephone Encounter (Signed)
Pt states she woke up this morning with some cramping and has had some spotting off and on today, pt is 9 weeks, pt denies any recent sex or straining with BM.  Advised some spotting and cramping can be normal in early pregnancy, advised pt to take some tylenol, push fluids and rest, if cramping gets severe, develops heavy bleeding or passing clots to call back.  Pt verbalized understanding.

## 2015-02-23 ENCOUNTER — Ambulatory Visit (INDEPENDENT_AMBULATORY_CARE_PROVIDER_SITE_OTHER): Payer: Medicaid Other

## 2015-02-23 ENCOUNTER — Other Ambulatory Visit: Payer: Medicaid Other

## 2015-02-23 DIAGNOSIS — Z3682 Encounter for antenatal screening for nuchal translucency: Secondary | ICD-10-CM

## 2015-02-23 DIAGNOSIS — Z36 Encounter for antenatal screening of mother: Secondary | ICD-10-CM

## 2015-02-23 DIAGNOSIS — Z3401 Encounter for supervision of normal first pregnancy, first trimester: Secondary | ICD-10-CM

## 2015-02-23 DIAGNOSIS — Z369 Encounter for antenatal screening, unspecified: Secondary | ICD-10-CM

## 2015-02-23 DIAGNOSIS — Z6841 Body Mass Index (BMI) 40.0 and over, adult: Secondary | ICD-10-CM

## 2015-02-23 NOTE — Progress Notes (Signed)
[place] TA/TV:  12+4wks,measurements c/w dates,CRL 63.47mm,NB present,NT 1.32mm,normal ov's bilat,fht 167bpm

## 2015-02-26 LAB — MATERNAL SCREEN, INTEGRATED #1
Crown Rump Length: 63.5 mm
Gest. Age on Collection Date: 12.6 weeks
Maternal Age at EDD: 25.5 years
Nuchal Translucency (NT): 1.6 mm
Number of Fetuses: 1
PAPP-A Value: 253.6 ng/mL
Weight: 329 [lb_av]

## 2015-02-28 ENCOUNTER — Encounter: Payer: Self-pay | Admitting: Women's Health

## 2015-02-28 ENCOUNTER — Ambulatory Visit (INDEPENDENT_AMBULATORY_CARE_PROVIDER_SITE_OTHER): Payer: Medicaid Other | Admitting: Women's Health

## 2015-02-28 VITALS — BP 136/66 | HR 64 | Wt 334.0 lb

## 2015-02-28 DIAGNOSIS — O09892 Supervision of other high risk pregnancies, second trimester: Secondary | ICD-10-CM | POA: Diagnosis not present

## 2015-02-28 DIAGNOSIS — O10919 Unspecified pre-existing hypertension complicating pregnancy, unspecified trimester: Secondary | ICD-10-CM

## 2015-02-28 DIAGNOSIS — Z1389 Encounter for screening for other disorder: Secondary | ICD-10-CM

## 2015-02-28 DIAGNOSIS — Z331 Pregnant state, incidental: Secondary | ICD-10-CM | POA: Diagnosis not present

## 2015-02-28 DIAGNOSIS — O10912 Unspecified pre-existing hypertension complicating pregnancy, second trimester: Secondary | ICD-10-CM

## 2015-02-28 DIAGNOSIS — O09891 Supervision of other high risk pregnancies, first trimester: Secondary | ICD-10-CM

## 2015-02-28 LAB — POCT URINALYSIS DIPSTICK
Blood, UA: NEGATIVE
GLUCOSE UA: NEGATIVE
KETONES UA: NEGATIVE
Leukocytes, UA: NEGATIVE
Nitrite, UA: NEGATIVE
Protein, UA: NEGATIVE

## 2015-02-28 MED ORDER — PRENATAL 27-0.8 MG PO TABS: 1.0000 | ORAL_TABLET | Freq: Every day | ORAL | Status: DC

## 2015-02-28 NOTE — Progress Notes (Signed)
High Risk Pregnancy Diagnosis(es): CHTN G1P0 [redacted]w[redacted]d Estimated Date of Delivery: 09/03/15 BP 136/66 mmHg  Pulse 64  Wt 334 lb (151.501 kg)  LMP 10/18/2014 (Approximate)  Urinalysis: Negative HPI:  Some cramping, no spotting since earlier sept. Has gained 14lbs since found out pregnant, 11lbs since last visit. Used to only eat 1 meal/day, now eats more frequent. Discussed diet, to cut out carbs, increase exercise/activity. Recommended no further weight gain, ok if loses weight by changing to healthier diet. Will do early gtt. H/O CHTN- dx few years ago, was on norvasc  daily, lost ~50lbs and took herself off of meds ~16yr ago. Will get baseline 24hr urine today and begin baby asa daily. Last 2 bp's have been normal, continue to monitor bp's.   BP, weight, and urine reviewed.  No fm yet. Denies lof, vb, uti s/s. No complaints.  Fundal Height:  13wks Fetal Heart rate:  160s via informal u/s, unable to hear w/ doppler Edema:  none  Reviewed warning s/s to report All questions were answered Assessment: [redacted]w[redacted]d CHTN- no meds Medication(s) Plans:  Begin baby asa daily Treatment Plan:  Get 24hr urine baseline and early 2hr gtt Follow up in Friday for 2hr gtt & turn in 24hr urine, then 4wks for high-risk OB appt and 2nd IT Declined flu shot, Had 1st it/nt 9/30

## 2015-02-28 NOTE — Patient Instructions (Addendum)
Begin taking a 81mg baby aspirin daily at 12 weeks of pregnancy to decrease risk of preeclampsia during pregnancy  You will have your sugar test next visit.  Please do not eat or drink anything after midnight the night before you come, not even water.  You will be here for at least two hours.     Second Trimester of Pregnancy The second trimester is from week 13 through week 28, months 4 through 6. The second trimester is often a time when you feel your best. Your body has also adjusted to being pregnant, and you begin to feel better physically. Usually, morning sickness has lessened or quit completely, you may have more energy, and you may have an increase in appetite. The second trimester is also a time when the fetus is growing rapidly. At the end of the sixth month, the fetus is about 9 inches long and weighs about 1 pounds. You will likely begin to feel the baby move (quickening) between 18 and 20 weeks of the pregnancy. BODY CHANGES Your body goes through many changes during pregnancy. The changes vary from woman to woman.   Your weight will continue to increase. You will notice your lower abdomen bulging out.  You may begin to get stretch marks on your hips, abdomen, and breasts.  You may develop headaches that can be relieved by medicines approved by your health care provider.  You may urinate more often because the fetus is pressing on your bladder.  You may develop or continue to have heartburn as a result of your pregnancy.  You may develop constipation because certain hormones are causing the muscles that push waste through your intestines to slow down.  You may develop hemorrhoids or swollen, bulging veins (varicose veins).  You may have back pain because of the weight gain and pregnancy hormones relaxing your joints between the bones in your pelvis and as a result of a shift in weight and the muscles that support your balance.  Your breasts will continue to grow and be  tender.  Your gums may bleed and may be sensitive to brushing and flossing.  Dark spots or blotches (chloasma, mask of pregnancy) may develop on your face. This will likely fade after the baby is born.  A dark line from your belly button to the pubic area (linea nigra) may appear. This will likely fade after the baby is born.  You may have changes in your hair. These can include thickening of your hair, rapid growth, and changes in texture. Some women also have hair loss during or after pregnancy, or hair that feels dry or thin. Your hair will most likely return to normal after your baby is born. WHAT TO EXPECT AT YOUR PRENATAL VISITS During a routine prenatal visit:  You will be weighed to make sure you and the fetus are growing normally.  Your blood pressure will be taken.  Your abdomen will be measured to track your baby's growth.  The fetal heartbeat will be listened to.  Any test results from the previous visit will be discussed. Your health care provider may ask you:  How you are feeling.  If you are feeling the baby move.  If you have had any abnormal symptoms, such as leaking fluid, bleeding, severe headaches, or abdominal cramping.  If you are using any tobacco products, including cigarettes, chewing tobacco, and electronic cigarettes.  If you have any questions. Other tests that may be performed during your second trimester include:  Blood tests   that check for:  Low iron levels (anemia).  Gestational diabetes (between 24 and 28 weeks).  Rh antibodies.  Urine tests to check for infections, diabetes, or protein in the urine.  An ultrasound to confirm the proper growth and development of the baby.  An amniocentesis to check for possible genetic problems.  Fetal screens for spina bifida and Down syndrome.  HIV (human immunodeficiency virus) testing. Routine prenatal testing includes screening for HIV, unless you choose not to have this test. HOME CARE  INSTRUCTIONS   Avoid all smoking, herbs, alcohol, and unprescribed drugs. These chemicals affect the formation and growth of the baby.  Do not use any tobacco products, including cigarettes, chewing tobacco, and electronic cigarettes. If you need help quitting, ask your health care provider. You may receive counseling support and other resources to help you quit.  Follow your health care provider's instructions regarding medicine use. There are medicines that are either safe or unsafe to take during pregnancy.  Exercise only as directed by your health care provider. Experiencing uterine cramps is a good sign to stop exercising.  Continue to eat regular, healthy meals.  Wear a good support bra for breast tenderness.  Do not use hot tubs, steam rooms, or saunas.  Wear your seat belt at all times when driving.  Avoid raw meat, uncooked cheese, cat litter boxes, and soil used by cats. These carry germs that can cause birth defects in the baby.  Take your prenatal vitamins.  Take 1500-2000 mg of calcium daily starting at the 20th week of pregnancy until you deliver your baby.  Try taking a stool softener (if your health care provider approves) if you develop constipation. Eat more high-fiber foods, such as fresh vegetables or fruit and whole grains. Drink plenty of fluids to keep your urine clear or pale yellow.  Take warm sitz baths to soothe any pain or discomfort caused by hemorrhoids. Use hemorrhoid cream if your health care provider approves.  If you develop varicose veins, wear support hose. Elevate your feet for 15 minutes, 3-4 times a day. Limit salt in your diet.  Avoid heavy lifting, wear low heel shoes, and practice good posture.  Rest with your legs elevated if you have leg cramps or low back pain.  Visit your dentist if you have not gone yet during your pregnancy. Use a soft toothbrush to brush your teeth and be gentle when you floss.  A sexual relationship may be  continued unless your health care provider directs you otherwise.  Continue to go to all your prenatal visits as directed by your health care provider. SEEK MEDICAL CARE IF:   You have dizziness.  You have mild pelvic cramps, pelvic pressure, or nagging pain in the abdominal area.  You have persistent nausea, vomiting, or diarrhea.  You have a bad smelling vaginal discharge.  You have pain with urination. SEEK IMMEDIATE MEDICAL CARE IF:   You have a fever.  You are leaking fluid from your vagina.  You have spotting or bleeding from your vagina.  You have severe abdominal cramping or pain.  You have rapid weight gain or loss.  You have shortness of breath with chest pain.  You notice sudden or extreme swelling of your face, hands, ankles, feet, or legs.  You have not felt your baby move in over an hour.  You have severe headaches that do not go away with medicine.  You have vision changes.   This information is not intended to replace advice given   to you by your health care provider. Make sure you discuss any questions you have with your health care provider.   Document Released: 05/06/2001 Document Revised: 06/02/2014 Document Reviewed: 07/13/2012 Elsevier Interactive Patient Education 2016 [employer]vier Inc.    

## 2015-03-02 ENCOUNTER — Other Ambulatory Visit: Payer: Medicaid Other

## 2015-03-02 DIAGNOSIS — Z131 Encounter for screening for diabetes mellitus: Secondary | ICD-10-CM

## 2015-03-03 LAB — GLUCOSE TOLERANCE, 2 HOURS W/ 1HR
GLUCOSE, 2 HOUR: 96 mg/dL (ref 65–152)
Glucose, 1 hour: 77 mg/dL (ref 65–179)
Glucose, Fasting: 81 mg/dL (ref 65–91)

## 2015-03-05 ENCOUNTER — Telehealth: Payer: Self-pay | Admitting: *Deleted

## 2015-03-06 LAB — PROTEIN, URINE, 24 HOUR
Protein, 24H Urine: 114.8 mg/(24.h) (ref 30.0–150.0)
Protein, Ur: 5.1 mg/dL

## 2015-03-06 NOTE — Telephone Encounter (Signed)
Left message to return call with no response x 3/cp

## 2015-03-28 ENCOUNTER — Encounter: Payer: Self-pay | Admitting: Women's Health

## 2015-03-28 ENCOUNTER — Ambulatory Visit (INDEPENDENT_AMBULATORY_CARE_PROVIDER_SITE_OTHER): Payer: Medicaid Other | Admitting: Women's Health

## 2015-03-28 VITALS — BP 130/58 | HR 76 | Wt 343.8 lb

## 2015-03-28 DIAGNOSIS — O10919 Unspecified pre-existing hypertension complicating pregnancy, unspecified trimester: Secondary | ICD-10-CM

## 2015-03-28 DIAGNOSIS — O10912 Unspecified pre-existing hypertension complicating pregnancy, second trimester: Secondary | ICD-10-CM | POA: Diagnosis not present

## 2015-03-28 DIAGNOSIS — Z1389 Encounter for screening for other disorder: Secondary | ICD-10-CM

## 2015-03-28 DIAGNOSIS — O09892 Supervision of other high risk pregnancies, second trimester: Secondary | ICD-10-CM | POA: Diagnosis not present

## 2015-03-28 DIAGNOSIS — Z331 Pregnant state, incidental: Secondary | ICD-10-CM

## 2015-03-28 DIAGNOSIS — Z363 Encounter for antenatal screening for malformations: Secondary | ICD-10-CM

## 2015-03-28 DIAGNOSIS — Z3682 Encounter for antenatal screening for nuchal translucency: Secondary | ICD-10-CM

## 2015-03-28 LAB — POCT URINALYSIS DIPSTICK
Glucose, UA: NEGATIVE
KETONES UA: NEGATIVE
Leukocytes, UA: NEGATIVE
Nitrite, UA: NEGATIVE
PROTEIN UA: NEGATIVE
RBC UA: NEGATIVE

## 2015-03-28 NOTE — Progress Notes (Signed)
High Risk Pregnancy Diagnosis(es): CHTN, no meds currently G1P0 2681w2d Estimated Date of Delivery: 09/03/15 BP 130/58 mmHg  Pulse 76  Wt 343 lb 12 oz (155.924 kg)  LMP 10/18/2014 (Approximate)  Urinalysis: Negative HPI:  Doing well BP, weight, and urine reviewed.  Reports good fm. Denies regular uc's, lof, vb, uti s/s. No complaints.  Fundal Height:  17wks Fetal Heart rate:  143 Edema:  trace  Reviewed warning s/s to report All questions were answered Assessment: 3881w2d CHTN, no meds currently Medication(s) Plans:  Continue baby asa daily Treatment Plan:  U/s @ 20, 28, 34, 38wks, begin 2x/wk testing @ 32wks Follow up in 3wks for high-risk OB appt and anatomy u/s 2nd IT today

## 2015-03-28 NOTE — Patient Instructions (Signed)

## 2015-03-30 LAB — MATERNAL SCREEN, INTEGRATED #2
AFP MARKER: 42.1 ng/mL
AFP MoM: 2.72
CROWN RUMP LENGTH: 63.5 mm
DIA MOM: 1.37
DIA Value: 152.1 pg/mL
Estriol, Unconjugated: 0.54 ng/mL
GESTATIONAL AGE: 17.3 wk
Gest. Age on Collection Date: 12.6 weeks
Maternal Age at EDD: 25.5 years
Nuchal Translucency (NT): 1.6 mm
Nuchal Translucency MoM: 1
Number of Fetuses: 1
PAPP-A MOM: 0.71
PAPP-A VALUE: 253.6 ng/mL
TEST RESULTS: POSITIVE — AB
Weight: 329 [lb_av]
Weight: 345 [lb_av]
hCG MoM: 5.68
hCG Value: 72.5 IU/mL
uE3 MoM: 0.66

## 2015-04-02 ENCOUNTER — Telehealth: Payer: Self-pay | Admitting: Women's Health

## 2015-04-02 ENCOUNTER — Encounter: Payer: Self-pay | Admitting: Women's Health

## 2015-04-02 DIAGNOSIS — O285 Abnormal chromosomal and genetic finding on antenatal screening of mother: Secondary | ICD-10-CM

## 2015-04-02 NOTE — Telephone Encounter (Signed)
Called pt, notified of abnormal IT results, increased r/f OSB 1:150, offered genetic counselor meeting- wants to do, scheduled for 04/10/15 @ 1000.  Cheral MarkerKimberly R. Booker, CNM, [facility]NP[facility]BC 04/02/2015 2:52 PM

## 2015-04-06 ENCOUNTER — Other Ambulatory Visit: Payer: Self-pay | Admitting: Obstetrics and Gynecology

## 2015-04-06 DIAGNOSIS — O280 Abnormal hematological finding on antenatal screening of mother: Secondary | ICD-10-CM

## 2015-04-10 ENCOUNTER — Ambulatory Visit (HOSPITAL_COMMUNITY): Payer: Private Health Insurance - Indemnity

## 2015-04-12 ENCOUNTER — Other Ambulatory Visit: Payer: Self-pay | Admitting: Obstetrics and Gynecology

## 2015-04-12 ENCOUNTER — Ambulatory Visit (HOSPITAL_COMMUNITY)
Admission: RE | Admit: 2015-04-12 | Discharge: 2015-04-12 | Disposition: A | Discharge status: Home / Self Care | Payer: Medicaid Other | Source: Ambulatory Visit | Attending: Obstetrics and Gynecology | Admitting: Obstetrics and Gynecology

## 2015-04-12 DIAGNOSIS — Z0489 Encounter for examination and observation for other specified reasons: Secondary | ICD-10-CM

## 2015-04-12 DIAGNOSIS — IMO0002 Reserved for concepts with insufficient information to code with codable children: Secondary | ICD-10-CM

## 2015-04-12 DIAGNOSIS — O99212 Obesity complicating pregnancy, second trimester: Secondary | ICD-10-CM

## 2015-04-12 DIAGNOSIS — O280 Abnormal hematological finding on antenatal screening of mother: Secondary | ICD-10-CM

## 2015-04-12 DIAGNOSIS — O10912 Unspecified pre-existing hypertension complicating pregnancy, second trimester: Secondary | ICD-10-CM

## 2015-04-12 DIAGNOSIS — Z315 Encounter for genetic counseling: Secondary | ICD-10-CM | POA: Diagnosis not present

## 2015-04-12 DIAGNOSIS — O281 Abnormal biochemical finding on antenatal screening of mother: Secondary | ICD-10-CM | POA: Diagnosis not present

## 2015-04-12 DIAGNOSIS — Z3A19 19 weeks gestation of pregnancy: Secondary | ICD-10-CM | POA: Insufficient documentation

## 2015-04-12 DIAGNOSIS — Z3689 Encounter for other specified antenatal screening: Secondary | ICD-10-CM

## 2015-04-12 NOTE — Progress Notes (Signed)
Genetic Counseling  High-Risk Gestation Note  Appointment Date:  04/12/2015 Referred By: Sara Burrow, MD Date of Birth:  1990/07/01 Partner:  Sara Vargas   Pregnancy History: G1P0 Estimated Date of Delivery: 09/03/15 Estimated Gestational Age: [redacted]w[redacted]d Attending: Alpha Gula, MD   Sara Vargas and her partner, Sara Vargas, were seen for consultation for genetic counseling because of an elevated MSAFP of 2.72 MoMs based on maternal serum screening through [facility]. They were also accompanied by the patient's mother.    We reviewed Sara Vargas's maternal serum screening result, the elevation of MSAFP, and the associated 1 in 150 risk for a fetal open neural tube defect.   We reviewed ONTDs, the typical multifactorial etiology, and variable prognosis.  In addition, we discussed additional explanations for an elevated MSAFP including: normal variation, twins, feto-maternal bleeding, a gestational dating error, abdominal wall defects, kidney differences, oligohydramnios, and placental problems.  We discussed that an unexplained elevation of MSAFP is associated with an increased risk for third trimester complications including: prematurity, low birth weight, and pre-eclampsia.    We reviewed additional available screening and diagnostic options including detailed ultrasound and amniocentesis.  We discussed the risks, limitations, and benefits of each.  After thoughtful consideration of these options, Sara Vargas elected to have ultrasound, but declined amniocentesis.  She understands that ultrasound cannot rule out all birth defects or genetic syndromes.  However, she was counseled that 80-90% of fetuses with ONTDs can be detected by detailed 2nd trimester ultrasound, when well visualized.  A complete ultrasound was performed today.  The ultrasound report will be sent under separate cover. A follow-up ultrasound is scheduled for 05/11/15.   Sara Vargas was provided with written  information regarding cystic fibrosis (CF) including the carrier frequency and incidence in the Caucasian population, the availability of carrier testing and prenatal diagnosis if indicated.  In addition, we discussed that CF is routinely screened for as part of the [facility] newborn screening panel.  She previous had normal/negative CF carrier screening through her OB office.   Both family histories were reviewed and found to be contributory for deafness for the patient's paternal aunt and paternal great-uncle. She had limited information regarding the cause. Her aunt was described to have a blood disorder that caused her hearing loss. She is otherwise healthy, and her children reportedly do not have hearing loss. The patient reported that limited information is available regarding these relatives' medication history, given that they were born in [place]. Hearing loss can have many causes including genetic factors, environmental factors or a combination of both.  Sometimes hearing loss can occur as one feature of an underlying genetic condition or may be caused by a single nonworking gene. Additional information regarding a cause for their hearing loss is needed in order to most accurately assess the chance for relatives. However, given the degree of relation, recurrence risk is likely low. We reviewed that hearing is assessed as part of newborn screening in [place]. Additional information regarding this history may alter recurrence risk assessment.   Additionally, the patient reported a female paternal first cousin once removed with cleft lip, who is otherwise healthy. We discussed that cleft lip +/- cleft palate can be syndromic or isolated.  If the patient's relative has a syndromic form of clefting, the chance of having an affected child depends on the inheritance pattern of that condition.  If the patient's relative has an isolated form of clefting, we discussed the probable multifactorial inheritance  and explained  that genetic testing for isolated cleft lip +/- cleft palate is not currently available.  Based on the family history, this couple's chance to have a baby with an isolated cleft lip +/- cleft palate is not expected to be increased above the general population risk. Without further information regarding the provided family history, an accurate genetic risk cannot be calculated. Further genetic counseling is warranted if more information is obtained.  Sara Vargas denied exposure to environmental toxins or chemical agents. She denied the use of alcohol or street drugs. She reported smoking cigarettes during the first [redacted] weeks gestation but has discontinued smoking. She denied significant viral illnesses during the course of her pregnancy. Her medical and surgical histories were contributory for high blood pressure, which does currently require medication for treatment.   I counseled this couple for approximately 35 minutes regarding the above risks and available options.    Sara PlowmanKaren Shaylea Ucci, MS,  Certified Genetic Counselor 04/12/2015

## 2015-04-18 ENCOUNTER — Ambulatory Visit (INDEPENDENT_AMBULATORY_CARE_PROVIDER_SITE_OTHER): Payer: Medicaid Other | Admitting: Advanced Practice Midwife

## 2015-04-18 ENCOUNTER — Ambulatory Visit: Payer: Medicaid Other

## 2015-04-18 ENCOUNTER — Encounter: Payer: Self-pay | Admitting: Advanced Practice Midwife

## 2015-04-18 VITALS — BP 140/70 | HR 74 | Wt 351.2 lb

## 2015-04-18 DIAGNOSIS — O09892 Supervision of other high risk pregnancies, second trimester: Secondary | ICD-10-CM | POA: Diagnosis not present

## 2015-04-18 DIAGNOSIS — Z331 Pregnant state, incidental: Secondary | ICD-10-CM

## 2015-04-18 DIAGNOSIS — O289 Unspecified abnormal findings on antenatal screening of mother: Secondary | ICD-10-CM | POA: Diagnosis not present

## 2015-04-18 DIAGNOSIS — Z1389 Encounter for screening for other disorder: Secondary | ICD-10-CM | POA: Diagnosis not present

## 2015-04-18 DIAGNOSIS — O10919 Unspecified pre-existing hypertension complicating pregnancy, unspecified trimester: Secondary | ICD-10-CM

## 2015-04-18 DIAGNOSIS — O10912 Unspecified pre-existing hypertension complicating pregnancy, second trimester: Secondary | ICD-10-CM | POA: Diagnosis not present

## 2015-04-18 DIAGNOSIS — O285 Abnormal chromosomal and genetic finding on antenatal screening of mother: Secondary | ICD-10-CM

## 2015-04-18 LAB — POCT URINALYSIS DIPSTICK
GLUCOSE UA: NEGATIVE
Ketones, UA: NEGATIVE
Leukocytes, UA: NEGATIVE
NITRITE UA: NEGATIVE
Protein, UA: NEGATIVE
RBC UA: NEGATIVE

## 2015-04-18 NOTE — Progress Notes (Signed)
Fetal Surveillance Testing today:  doppler   High Risk Pregnancy Diagnosis(es):   CHTN (off meds, but now back up to weight when she was on meds) elevated MSAFP  G1P0 3665w2d Estimated Date of Delivery: 09/03/15  Blood pressure 140/70, pulse 74, weight 351 lb 3.2 oz (159.303 kg), last menstrual period 10/18/2014.  Urinalysis: Negative   HPI: The patient is being seen today for ongoing management of pregnancy., CHTN, abnormal AFP, 1:150 OSB; She had anatomy scan a few days ago with MFM which showed normal spine.  US was incomplete, so they plan FU in a few weeks. . Today she reports no complaints   BP weight and urine results all reviewed and noted. Patient reports good fetal movement, denies any bleeding and no rupture of membranes symptoms or regular contractions.  Fetal Heart rate:  141 Edema:  no  Patient is without complaints other than noted in her HPI. All questions were answered.  All lab and sonogram results have been reviewed. Comments: normal   Assessment:  1.  Pregnancy at 2665w2d,  Estimated Date of Delivery: 09/03/15 :                          2.  No evidence US of OSB                        3.  Elevated AFP  Medication(s) Plans:  none  Treatment Plan:  3rd trimester testing/growth US q month with [facility]  Return in about 4 weeks (around 05/16/2015) for HROB. for appointment for high risk OB care  No orders of the defined types were placed in this encounter.   Orders Placed This Encounter  Procedures  . POCT urinalysis dipstick

## 2015-04-20 ENCOUNTER — Emergency Department (HOSPITAL_COMMUNITY)
Admission: EM | Admit: 2015-04-20 | Discharge: 2015-04-20 | Disposition: A | Discharge status: Home / Self Care | Payer: Medicaid Other | Attending: Emergency Medicine | Admitting: Emergency Medicine

## 2015-04-20 ENCOUNTER — Encounter (HOSPITAL_COMMUNITY): Payer: Self-pay

## 2015-04-20 DIAGNOSIS — Z79899 Other long term (current) drug therapy: Secondary | ICD-10-CM | POA: Diagnosis not present

## 2015-04-20 DIAGNOSIS — K529 Noninfective gastroenteritis and colitis, unspecified: Secondary | ICD-10-CM | POA: Diagnosis not present

## 2015-04-20 DIAGNOSIS — M791 Myalgia: Secondary | ICD-10-CM | POA: Insufficient documentation

## 2015-04-20 DIAGNOSIS — I10 Essential (primary) hypertension: Secondary | ICD-10-CM | POA: Diagnosis not present

## 2015-04-20 DIAGNOSIS — Z7982 Long term (current) use of aspirin: Secondary | ICD-10-CM | POA: Insufficient documentation

## 2015-04-20 DIAGNOSIS — Z87891 Personal history of nicotine dependence: Secondary | ICD-10-CM | POA: Insufficient documentation

## 2015-04-20 DIAGNOSIS — R1110 Vomiting, unspecified: Secondary | ICD-10-CM | POA: Diagnosis present

## 2015-04-20 LAB — CBC WITH DIFFERENTIAL/PLATELET
Basophils Absolute: 0 10*3/uL (ref 0.0–0.1)
Basophils Relative: 0 %
Eosinophils Absolute: 0 10*3/uL (ref 0.0–0.7)
Eosinophils Relative: 0 %
HCT: 34 % — ABNORMAL LOW (ref 36.0–46.0)
HEMOGLOBIN: 11.5 g/dL — AB (ref 12.0–15.0)
LYMPHS ABS: 0.5 10*3/uL — AB (ref 0.7–4.0)
LYMPHS PCT: 6 %
MCH: 29.6 pg (ref 26.0–34.0)
MCHC: 33.8 g/dL (ref 30.0–36.0)
MCV: 87.4 fL (ref 78.0–100.0)
Monocytes Absolute: 0.3 10*3/uL (ref 0.1–1.0)
Monocytes Relative: 3 %
NEUTROS PCT: 91 %
Neutro Abs: 8.7 10*3/uL — ABNORMAL HIGH (ref 1.7–7.7)
Platelets: 210 10*3/uL (ref 150–400)
RBC: 3.89 MIL/uL (ref 3.87–5.11)
RDW: 13.2 % (ref 11.5–15.5)
WBC: 9.6 10*3/uL (ref 4.0–10.5)

## 2015-04-20 LAB — COMPREHENSIVE METABOLIC PANEL
ALK PHOS: 79 U/L (ref 38–126)
ALT: 21 U/L (ref 14–54)
AST: 17 U/L (ref 15–41)
Albumin: 2.9 g/dL — ABNORMAL LOW (ref 3.5–5.0)
Anion gap: 7 (ref 5–15)
BUN: 13 mg/dL (ref 6–20)
CALCIUM: 8.4 mg/dL — AB (ref 8.9–10.3)
CO2: 25 mmol/L (ref 22–32)
CREATININE: 0.58 mg/dL (ref 0.44–1.00)
Chloride: 105 mmol/L (ref 101–111)
Glucose, Bld: 101 mg/dL — ABNORMAL HIGH (ref 65–99)
Potassium: 3.8 mmol/L (ref 3.5–5.1)
Sodium: 137 mmol/L (ref 135–145)
Total Bilirubin: 1.2 mg/dL (ref 0.3–1.2)
Total Protein: 5.9 g/dL — ABNORMAL LOW (ref 6.5–8.1)

## 2015-04-20 MED ORDER — SODIUM CHLORIDE 0.9 % IV BOLUS (SEPSIS)
2000.0000 mL | Freq: Once | INTRAVENOUS | Status: AC
  Administered 2015-04-20: 2000 mL via INTRAVENOUS

## 2015-04-20 MED ORDER — ACETAMINOPHEN 325 MG PO TABS
650.0000 mg | ORAL_TABLET | Freq: Once | ORAL | Status: AC
  Administered 2015-04-20: 650 mg via ORAL
  Filled 2015-04-20: qty 2

## 2015-04-20 MED ORDER — ONDANSETRON 4 MG PO TBDP: ORAL_TABLET | ORAL | Status: DC

## 2015-04-20 MED ORDER — ONDANSETRON 4 MG PO TBDP
4.0000 mg | ORAL_TABLET | Freq: Once | ORAL | Status: AC
  Administered 2015-04-20: 4 mg via ORAL
  Filled 2015-04-20: qty 1

## 2015-04-20 NOTE — ED Provider Notes (Signed)
CSN: [id number]     Arrival date & time 04/20/15  0865 History  By signing my name below, I, Lyndel Safe, attest that this documentation has been prepared under the direction and in the presence of Bethann Berkshire, MD. Electronically Signed: Lyndel Safe, ED Scribe. 04/20/2015. 10:34 AM.   Chief Complaint  Patient presents with  . Emesis    Patient is a 24 y.o. female presenting with vomiting. The history is provided by the patient. No language interpreter was used.  Emesis Severity:  Moderate Duration:  3 days Timing:  Constant Progression:  Unchanged Chronicity:  New Recent urination:  Normal Relieved by:  None tried Ineffective treatments:  None tried Associated symptoms: abdominal pain, diarrhea and myalgias ( generalized)   Associated symptoms: no headaches   Risk factors: pregnant now and sick contacts    HPI Comments: Sara Vargas is a 24 y.o. female, who is [redacted] weeks gestation, presents to the Emergency Department complaining of persistent non bilious, non bloody emesis and watery diarrhea X 3 days with associated generalized myalgias. Positive for sick contact with similar symptoms. Denies hematemesis, hematochezia and melena. She is followed by Dr. Emelda Fear at [facility].   Past Medical History  Diagnosis Date  . Hypertension     no meds   Past Surgical History  Procedure Laterality Date  . Tonsillectomy    . Arm surgery    . Ureteral exploration     Family History  Problem Relation Age of Onset  . Heart disease Mother   . COPD Mother   . Asthma Mother   . Other Father     benign brain tumor  . Heart disease Maternal Grandmother    Social History  Substance Use Topics  . Smoking status: Former Smoker -- 6.00 packs/day    Types: Cigarettes  . Smokeless tobacco: Never Used  . Alcohol Use: No   OB History    Gravida Para Term Preterm AB TAB SAB Ectopic Multiple Living   1              Review of Systems  Constitutional: Negative for  appetite change and fatigue.  HENT: Negative for congestion, ear discharge and sinus pressure.   Eyes: Negative for discharge.  Respiratory: Negative for cough.   Cardiovascular: Negative for chest pain.  Gastrointestinal: Positive for vomiting, abdominal pain and diarrhea.  Genitourinary: Negative for frequency and hematuria.  Musculoskeletal: Positive for myalgias ( generalized). Negative for back pain.  Skin: Negative for rash.  Neurological: Negative for seizures and headaches.  Psychiatric/Behavioral: Negative for hallucinations.   Allergies  Review of patient's allergies indicates no known allergies.  Home Medications   Prior to Admission medications   Medication Sig Start Date End Date Taking? Authorizing Provider  acetaminophen (TYLENOL) 500 MG tablet Take 500 mg by mouth every 6 (six) hours as needed.   Yes Historical Provider, MD  aspirin 81 MG tablet Take 81 mg by mouth daily.   Yes Historical Provider, MD  Prenatal Vit-Fe Fumarate-FA (MULTIVITAMIN-PRENATAL) 27-0.8 MG TABS tablet Take 1 tablet by mouth daily at 12 noon. 02/28/15  Yes Cheral Marker, CNM   BP 124/73 mmHg  Pulse 117  Temp(Src) 98.5 F (36.9 C) (Oral)  Resp 22  Ht  (1.651 m)  Wt 351 lb (159.213 kg)  BMI 58.41 kg/m2  SpO2 100%  LMP 10/18/2014 (Approximate) Physical Exam  Constitutional: She is oriented to person, place, and time. She appears well-developed.  HENT:  Head: Normocephalic.  Eyes: Conjunctivae and EOM are normal. No scleral icterus.  Neck: Neck supple. No thyromegaly present.  Cardiovascular: Normal rate and regular rhythm.  Exam reveals no gallop and no friction rub.   No murmur heard. Pulmonary/Chest: No stridor. She has no wheezes. She has no rales. She exhibits no tenderness.  Abdominal: Soft. She exhibits no distension. There is tenderness. There is no rebound.  Minimal tenderness diffusely through abdomen.   Musculoskeletal: Normal range of motion. She exhibits no edema.   Lymphadenopathy:    She has no cervical adenopathy.  Neurological: She is oriented to person, place, and time. She exhibits normal muscle tone. Coordination normal.  Skin: No rash noted. No erythema.  Psychiatric: She has a normal mood and affect. Her behavior is normal.    ED Course  Procedures  DIAGNOSTIC STUDIES: Oxygen Saturation is 100% on RA, normal by my interpretation.    COORDINATION OF CARE: 10:30 AM Discussed treatment plan which includes to order IV fluids and diagnostic labs  with pt. Pt acknowledges and agrees to plan.   Labs Review Labs Reviewed  CBC WITH DIFFERENTIAL/PLATELET - Abnormal; Notable for the following:    Hemoglobin 11.5 (*)    HCT 34.0 (*)    Neutro Abs 8.7 (*)    Lymphs Abs 0.5 (*)    All other components within normal limits  COMPREHENSIVE METABOLIC PANEL - Abnormal; Notable for the following:    Glucose, Bld 101 (*)    Calcium 8.4 (*)    Total Protein 5.9 (*)    Albumin 2.9 (*)    All other components within normal limits  I have personally reviewed and evaluated these lab results as part of my medical decision-making.   MDM   Final diagnoses:  None    Gastro enteritis mild dehydration patient improved with IV fluids and will follow-up with her OB/GYN next week. Patient given Zofran   The chart was scribed for me under my direct supervision.  I personally performed the history, physical, and medical decision making and all procedures in the evaluation of this patient.Bethann Berkshire.    Danyal Adorno, MD 04/20/15 [phone]

## 2015-04-20 NOTE — ED Notes (Signed)
Pt complain of n/v/d, fever and aching all over. Pt also states she is [redacted] weeks pregnant

## 2015-04-20 NOTE — Discharge Instructions (Signed)
Tylenol for fever and aches.  zofran for nausea.  Drink plenty of fluids and follow up with your md next week

## 2015-05-11 ENCOUNTER — Encounter (HOSPITAL_COMMUNITY): Payer: Self-pay

## 2015-05-11 ENCOUNTER — Ambulatory Visit (HOSPITAL_COMMUNITY)
Admission: RE | Admit: 2015-05-11 | Discharge: 2015-05-11 | Disposition: A | Discharge status: Home / Self Care | Payer: Medicaid Other | Source: Ambulatory Visit | Attending: Obstetrics and Gynecology | Admitting: Obstetrics and Gynecology

## 2015-05-11 ENCOUNTER — Other Ambulatory Visit (HOSPITAL_COMMUNITY): Payer: Self-pay | Admitting: Maternal and Fetal Medicine

## 2015-05-11 DIAGNOSIS — O99212 Obesity complicating pregnancy, second trimester: Secondary | ICD-10-CM

## 2015-05-11 DIAGNOSIS — Z3A23 23 weeks gestation of pregnancy: Secondary | ICD-10-CM

## 2015-05-11 DIAGNOSIS — IMO0002 Reserved for concepts with insufficient information to code with codable children: Secondary | ICD-10-CM

## 2015-05-11 DIAGNOSIS — O280 Abnormal hematological finding on antenatal screening of mother: Secondary | ICD-10-CM

## 2015-05-11 DIAGNOSIS — Z0489 Encounter for examination and observation for other specified reasons: Secondary | ICD-10-CM

## 2015-05-11 DIAGNOSIS — O283 Abnormal ultrasonic finding on antenatal screening of mother: Secondary | ICD-10-CM | POA: Insufficient documentation

## 2015-05-11 DIAGNOSIS — O10012 Pre-existing essential hypertension complicating pregnancy, second trimester: Secondary | ICD-10-CM | POA: Insufficient documentation

## 2015-05-11 DIAGNOSIS — Z36 Encounter for antenatal screening of mother: Secondary | ICD-10-CM | POA: Insufficient documentation

## 2015-05-16 ENCOUNTER — Ambulatory Visit (INDEPENDENT_AMBULATORY_CARE_PROVIDER_SITE_OTHER): Payer: Medicaid Other | Admitting: Obstetrics and Gynecology

## 2015-05-16 ENCOUNTER — Encounter: Payer: Self-pay | Admitting: Obstetrics and Gynecology

## 2015-05-16 VITALS — BP 128/86 | HR 92 | Wt 357.0 lb

## 2015-05-16 DIAGNOSIS — O10912 Unspecified pre-existing hypertension complicating pregnancy, second trimester: Secondary | ICD-10-CM | POA: Diagnosis not present

## 2015-05-16 DIAGNOSIS — O10919 Unspecified pre-existing hypertension complicating pregnancy, unspecified trimester: Secondary | ICD-10-CM

## 2015-05-16 DIAGNOSIS — Z6841 Body Mass Index (BMI) 40.0 and over, adult: Secondary | ICD-10-CM

## 2015-05-16 DIAGNOSIS — Z1389 Encounter for screening for other disorder: Secondary | ICD-10-CM

## 2015-05-16 DIAGNOSIS — O09899 Supervision of other high risk pregnancies, unspecified trimester: Secondary | ICD-10-CM

## 2015-05-16 DIAGNOSIS — Z331 Pregnant state, incidental: Secondary | ICD-10-CM | POA: Diagnosis not present

## 2015-05-16 LAB — POCT URINALYSIS DIPSTICK
GLUCOSE UA: NEGATIVE
Ketones, UA: NEGATIVE
Leukocytes, UA: NEGATIVE
NITRITE UA: NEGATIVE
PROTEIN UA: NEGATIVE
RBC UA: NEGATIVE

## 2015-05-16 NOTE — Progress Notes (Signed)
Pt denies any problems or concerns at this time.  

## 2015-05-16 NOTE — Progress Notes (Signed)
Patient ID: Sara Vargas, female   DOB: 10/28/1990, 24 y.o.   MRN: [id number]  Fetal Surveillance Testing today:  none   High Risk Pregnancy Diagnosis(es): CHTN, elevated MSAFP  G1P0 8466w2d Estimated Date of Delivery: 09/03/15  Blood pressure 128/86, pulse 92, weight 357 lb (161.934 kg), last menstrual period 10/18/2014.  Urinalysis: Negative   HPI: The patient is being seen today for ongoing management of CHTN, MSAFP, 1:150 OSB; pt has had anatomy scan with MFM which showed normal spine. Pt had US on 12/16 with normal findings.  Today she reports some weight gain as she progresses through her pregnancy. Pt reports that she eats 2 meals a day, increased from 1 meal per day before pregnancy. Pt notes that she both cooks at home and eats prepared meals. She also reports drinking 3 12 oz cans of Coke a day= 450+ cals/day. 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    BP weight and urine results all reviewed and noted. Patient reports good fetal movement, denies any bleeding and no rupture of membranes symptoms or regular contractions.  Fundal Height:  30 cm  Fetal Heart rate:  161 bpm  Edema:  none  Patient is without complaints other than noted in her HPI. All questions were answered.  All lab and sonogram results have been reviewed. Comments: normal Urine negative. BP 128/86 in office.   Assessment:  1.  Pregnancy at 4766w2d,  Estimated Date of Delivery: 09/03/15; G1P0                         2.  No US evidence of OSB or cardiac disease                         3. Excess weight gain in pregnancy: Pt was 323 lbs @ 5852w3d; 357 lbs today  Medication(s) Plans:  none  Treatment Plan:  Weight management. Advised pt to stay active. Counseled pt on counting calories and use a calorie counting/fitness app. Test for gestational DM at 28 weeks. F/u in 4 weeks.   Discussed with pt at length weight loss strategies during pregnancy to avoid gaining additional weight. Advised pt to count calories and  stay active. At end of discussion, pt had opportunity to ask questions and has no further questions at this time.   Greater than 50% was spent in counseling and coordination of care with the patient. Total time greater than: 25 minutes   No Follow-up on file. for appointment for high risk OB care  No orders of the defined types were placed in this encounter.   Orders Placed This Encounter  Procedures  . POCT urinalysis dipstick    By signing my name below, I, Doreatha MartinEva Mathews, attest that this documentation has been prepared under the direction and in the presence of Tilda BurrowJohn Tamikia Chowning V, MD. Electronically Signed: Doreatha MartinEva Mathews, ED Scribe. 05/16/2015. 10:55 AM.  I personally performed the services described in this documentation, which was SCRIBED in my presence. The recorded information has been reviewed and considered accurate. It has been edited as necessary during review. Tilda BurrowFERGUSON,Alysson Geist V, MD

## 2015-05-27 NOTE — L&D Delivery Note (Signed)
Please refer to cesarean section operative note.  Sara Vargas  Tria Noguera, MD, FACOG Attending Obstetrician & Gynecologist, [facility], [facility]

## 2015-06-13 ENCOUNTER — Encounter: Payer: Self-pay | Admitting: Women's Health

## 2015-06-13 ENCOUNTER — Ambulatory Visit (INDEPENDENT_AMBULATORY_CARE_PROVIDER_SITE_OTHER): Payer: Medicaid Other | Admitting: Women's Health

## 2015-06-13 ENCOUNTER — Other Ambulatory Visit: Payer: Medicaid Other

## 2015-06-13 VITALS — BP 144/78 | HR 80 | Wt 360.0 lb

## 2015-06-13 DIAGNOSIS — Z131 Encounter for screening for diabetes mellitus: Secondary | ICD-10-CM

## 2015-06-13 DIAGNOSIS — L299 Pruritus, unspecified: Secondary | ICD-10-CM | POA: Diagnosis not present

## 2015-06-13 DIAGNOSIS — O10912 Unspecified pre-existing hypertension complicating pregnancy, second trimester: Secondary | ICD-10-CM | POA: Diagnosis not present

## 2015-06-13 DIAGNOSIS — Z369 Encounter for antenatal screening, unspecified: Secondary | ICD-10-CM

## 2015-06-13 DIAGNOSIS — O09893 Supervision of other high risk pregnancies, third trimester: Secondary | ICD-10-CM

## 2015-06-13 DIAGNOSIS — Z1389 Encounter for screening for other disorder: Secondary | ICD-10-CM | POA: Diagnosis not present

## 2015-06-13 DIAGNOSIS — Z331 Pregnant state, incidental: Secondary | ICD-10-CM | POA: Diagnosis not present

## 2015-06-13 DIAGNOSIS — O10919 Unspecified pre-existing hypertension complicating pregnancy, unspecified trimester: Secondary | ICD-10-CM

## 2015-06-13 DIAGNOSIS — O368130 Decreased fetal movements, third trimester, not applicable or unspecified: Secondary | ICD-10-CM | POA: Diagnosis not present

## 2015-06-13 DIAGNOSIS — O289 Unspecified abnormal findings on antenatal screening of mother: Secondary | ICD-10-CM

## 2015-06-13 DIAGNOSIS — O280 Abnormal hematological finding on antenatal screening of mother: Secondary | ICD-10-CM

## 2015-06-13 LAB — POCT URINALYSIS DIPSTICK
Blood, UA: NEGATIVE
GLUCOSE UA: NEGATIVE
Ketones, UA: NEGATIVE
LEUKOCYTES UA: NEGATIVE
NITRITE UA: NEGATIVE
Protein, UA: NEGATIVE

## 2015-06-13 NOTE — Progress Notes (Signed)
High Risk Pregnancy Diagnosis(es): CHTN- no meds, elevated MSAFP G1P0 [redacted]w[redacted]d Estimated Date of Delivery: 09/03/15 BP 144/78 mmHg  Pulse 80  Wt 360 lb (163.295 kg)  LMP 10/18/2014 (Approximate)  Urinalysis: Negative HPI:  Doesn't feel good, has been nauseated last few days, not sleeping well, has not been taking baby asa as she forgets, generalized itching- also on palms/soles, worse at night BP, weight, and urine reviewed.  Reports decreased fm for last week. Denies regular uc's, lof, vb, uti s/s.   Fundal Height:  34cm, U+16 Fetal Heart rate:  140, reactive nst- pt felt fm during nst Edema:  trace  Reviewed ptl s/s, fkc. Recommended Tdap at HD/PCP per CDC guidelines.  All questions were answered Assessment: [redacted]w[redacted]d CHTN, elevated MSAFP Medication(s) Plans:  Resume baby asa, no bp meds for now Treatment Plan:  Growth u/s asap, then q 4wks, begin 2x/wk testing @ 32wks d/t elevated MSAFP Follow up in asap for growth u/s (no visit), then 2wks for hrob PN2 today, also check CMP, bile acids

## 2015-06-13 NOTE — Patient Instructions (Addendum)
Start taking your baby aspirin ( ) every day again  Call the office [phone]) or go to [facility] if:  You begin to have strong, frequent contractions  Your water breaks.  Sometimes it is a big gush of fluid, sometimes it is just a trickle that keeps getting your panties wet or running down your legs  You have vaginal bleeding.  It is normal to have a small amount of spotting if your cervix was checked.   You don't feel your baby moving like normal.  If you don't, get you something to eat and drink and lay down and focus on feeling your baby move.  You should feel at least 10 movements in 2 hours.  If you don't, you should call the office or go to [facility].    Tdap Vaccine  It is recommended that you get the Tdap vaccine during the third trimester of EACH pregnancy to help protect your baby from getting pertussis (whooping cough)  27-36 weeks is the BEST time to do this so that you can pass the protection on to your baby. During pregnancy is better than after pregnancy, but if you are unable to get it during pregnancy it will be offered at the hospital.   You can get this vaccine at the health department or your family doctor  Everyone who will be around your baby should also be up-to-date on their vaccines. Adults (who are not pregnant) only need 1 dose of Tdap during adulthood.   Tips to Help You Sleep Better:   Get into a bedtime routine, try to do the same thing every night before going to bed to try to help your body wind down  Warm baths  Avoid caffeine for at least 3 hours before going to sleep   Keep your room at a slightly cooler temperature, can try running a fan  Turn off TV, lights, phone, electronics  Lots of pillows if needed to help you get comfortable  Lavender scented items can help you sleep. You can place lavender essential oil on a cotton ball and place under your pillowcase, or place in a diffuser. [employer] has a lavender scented sleep line  (plug-ins, sprays, etc). Look in the pillow aisle for lavender scented pillows.   If none of the above things help, you can try 1/2 to 1 tablet of benadryl, unisom, or tylenol pm. Do not take this every night, only when you really need it.   [place] Pediatricians/Family Doctors:  [facility] [phone]            [facility] [phone]                 [facility] [phone] (usually not accepting new patients unless you have family there already, you are always welcome to call and ask)            [facility] ([address]) [phone]   [facility] Pediatricians/Family Doctors:   [facility]: [phone]  [facility]: [phone]   Third Trimester of Pregnancy The third trimester is from week 29 through week 42, months 7 through 9. The third trimester is a time when the fetus is growing rapidly. At the end of the ninth month, the fetus is about 20 inches in length and weighs 6-10 pounds.  BODY CHANGES Your body goes through many changes during pregnancy. The changes vary from woman to woman.  17. Your weight will continue to increase. You can expect to  gain 25-35 pounds (11-16 kg) by the end of the pregnancy. 18. You may begin to get stretch marks on your hips, abdomen, and breasts. 19. You may urinate more often because the fetus is moving lower into your pelvis and pressing on your bladder. 20. You may develop or continue to have heartburn as a result of your pregnancy. 21. You may develop constipation because certain hormones are causing the muscles that push waste through your intestines to slow down. 22. You may develop hemorrhoids or swollen, bulging veins (varicose veins). 23. You may have pelvic pain because of the weight gain and pregnancy hormones relaxing your joints between the bones in your pelvis. Backaches may result from overexertion of the muscles supporting your  posture. 24. You may have changes in your hair. These can include thickening of your hair, rapid growth, and changes in texture. Some women also have hair loss during or after pregnancy, or hair that feels dry or thin. Your hair will most likely return to normal after your baby is born. 25. Your breasts will continue to grow and be tender. A yellow discharge may leak from your breasts called colostrum. 26. Your belly button may stick out. 27. You may feel short of breath because of your expanding uterus. 28. You may notice the fetus "dropping," or moving lower in your abdomen. 29. You may have a bloody mucus discharge. This usually occurs a few days to a week before labor begins. 30. Your cervix becomes thin and soft (effaced) near your due date. WHAT TO EXPECT AT YOUR PRENATAL EXAMS  You will have prenatal exams every 2 weeks until week 36. Then, you will have weekly prenatal exams. During a routine prenatal visit: 3. You will be weighed to make sure you and the fetus are growing normally. 4. Your blood pressure is taken. 5. Your abdomen will be measured to track your baby's growth. 6. The fetal heartbeat will be listened to. 7. Any test results from the previous visit will be discussed. 8. You may have a cervical check near your due date to see if you have effaced. At around 36 weeks, your caregiver will check your cervix. At the same time, your caregiver will also perform a test on the secretions of the vaginal tissue. This test is to determine if a type of bacteria, Group B streptococcus, is present. Your caregiver will explain this further. Your caregiver may ask you: 2. What your birth plan is. 3. How you are feeling. 4. If you are feeling the baby move. 5. If you have had any abnormal symptoms, such as leaking fluid, bleeding, severe headaches, or abdominal cramping. 6. If you have any questions. Other tests or screenings that may be performed during your third trimester  include: 2. Blood tests that check for low iron levels (anemia). 3. Fetal testing to check the health, activity level, and growth of the fetus. Testing is done if you have certain medical conditions or if there are problems during the pregnancy. FALSE LABOR You may feel small, irregular contractions that eventually go away. These are called Braxton Hicks contractions, or false labor. Contractions may last for hours, days, or even weeks before true labor sets in. If contractions come at regular intervals, intensify, or become painful, it is best to be seen by your caregiver.  SIGNS OF LABOR  3. Menstrual-like cramps. 4. Contractions that are 5 minutes apart or less. 5. Contractions that start on the top of the uterus and spread down to the lower  abdomen and back. 6. A sense of increased pelvic pressure or back pain. 7. A watery or bloody mucus discharge that comes from the vagina. If you have any of these signs before the 37th week of pregnancy, call your caregiver right away. You need to go to the hospital to get checked immediately. HOME CARE INSTRUCTIONS   Avoid all smoking, herbs, alcohol, and unprescribed drugs. These chemicals affect the formation and growth of the baby.  Follow your caregiver's instructions regarding medicine use. There are medicines that are either safe or unsafe to take during pregnancy.  Exercise only as directed by your caregiver. Experiencing uterine cramps is a good sign to stop exercising.  Continue to eat regular, healthy meals.  Wear a good support bra for breast tenderness.  Do not use hot tubs, steam rooms, or saunas.  Wear your seat belt at all times when driving.  Avoid raw meat, uncooked cheese, cat litter boxes, and soil used by cats. These carry germs that can cause birth defects in the baby.  Take your prenatal vitamins.  Try taking a stool softener (if your caregiver approves) if you develop constipation. Eat more high-fiber foods, such as  fresh vegetables or fruit and whole grains. Drink plenty of fluids to keep your urine clear or pale yellow.  Take warm sitz baths to soothe any pain or discomfort caused by hemorrhoids. Use hemorrhoid cream if your caregiver approves.  If you develop varicose veins, wear support hose. Elevate your feet for 15 minutes, 3-4 times a day. Limit salt in your diet.  Avoid heavy lifting, wear low heal shoes, and practice good posture.  Rest a lot with your legs elevated if you have leg cramps or low back pain.  Visit your dentist if you have not gone during your pregnancy. Use a soft toothbrush to brush your teeth and be gentle when you floss.  A sexual relationship may be continued unless your caregiver directs you otherwise.  Do not travel far distances unless it is absolutely necessary and only with the approval of your caregiver.  Take prenatal classes to understand, practice, and ask questions about the labor and delivery.  Make a trial run to the hospital.  Pack your hospital bag.  Prepare the baby's nursery.  Continue to go to all your prenatal visits as directed by your caregiver. SEEK MEDICAL CARE IF:  You are unsure if you are in labor or if your water has broken.  You have dizziness.  You have mild pelvic cramps, pelvic pressure, or nagging pain in your abdominal area.  You have persistent nausea, vomiting, or diarrhea.  You have a bad smelling vaginal discharge.  You have pain with urination. SEEK IMMEDIATE MEDICAL CARE IF:   You have a fever.  You are leaking fluid from your vagina.  You have spotting or bleeding from your vagina.  You have severe abdominal cramping or pain.  You have rapid weight loss or gain.  You have shortness of breath with chest pain.  You notice sudden or extreme swelling of your face, hands, ankles, feet, or legs.  You have not felt your baby move in over an hour.  You have severe headaches that do not go away with  medicine.  You have vision changes. Document Released: 05/06/2001 Document Revised: 05/17/2013 Document Reviewed: 07/13/2012 [employer] Patient Information 2015 [place], [place]. This information is not intended to replace advice given to you by your health care provider. Make sure you discuss any questions you have with your  health care provider.

## 2015-06-14 ENCOUNTER — Ambulatory Visit (INDEPENDENT_AMBULATORY_CARE_PROVIDER_SITE_OTHER): Payer: Medicaid Other

## 2015-06-14 DIAGNOSIS — O10912 Unspecified pre-existing hypertension complicating pregnancy, second trimester: Secondary | ICD-10-CM

## 2015-06-14 DIAGNOSIS — O09893 Supervision of other high risk pregnancies, third trimester: Secondary | ICD-10-CM | POA: Diagnosis not present

## 2015-06-14 DIAGNOSIS — O09892 Supervision of other high risk pregnancies, second trimester: Secondary | ICD-10-CM

## 2015-06-14 DIAGNOSIS — O289 Unspecified abnormal findings on antenatal screening of mother: Secondary | ICD-10-CM

## 2015-06-14 DIAGNOSIS — Z6841 Body Mass Index (BMI) 40.0 and over, adult: Secondary | ICD-10-CM

## 2015-06-14 DIAGNOSIS — O280 Abnormal hematological finding on antenatal screening of mother: Secondary | ICD-10-CM

## 2015-06-14 DIAGNOSIS — O10919 Unspecified pre-existing hypertension complicating pregnancy, unspecified trimester: Secondary | ICD-10-CM

## 2015-06-14 LAB — GLUCOSE TOLERANCE, 2 HOURS W/ 1HR
GLUCOSE, 1 HOUR: 122 mg/dL (ref 65–179)
GLUCOSE, 2 HOUR: 88 mg/dL (ref 65–152)
Glucose, Fasting: 83 mg/dL (ref 65–91)

## 2015-06-14 LAB — CBC
HEMATOCRIT: 33.8 % — AB (ref 34.0–46.6)
HEMOGLOBIN: 11.4 g/dL (ref 11.1–15.9)
MCH: 29.2 pg (ref 26.6–33.0)
MCHC: 33.7 g/dL (ref 31.5–35.7)
MCV: 87 fL (ref 79–97)
Platelets: 251 10*3/uL (ref 150–379)
RBC: 3.9 x10E6/uL (ref 3.77–5.28)
RDW: 13.3 % (ref 12.3–15.4)
WBC: 8.5 10*3/uL (ref 3.4–10.8)

## 2015-06-14 LAB — COMPREHENSIVE METABOLIC PANEL
A/G RATIO: 1.4 (ref 1.1–2.5)
ALK PHOS: 134 IU/L — AB (ref 39–117)
ALT: 33 IU/L — AB (ref 0–32)
AST: 14 IU/L (ref 0–40)
Albumin: 3.3 g/dL — ABNORMAL LOW (ref 3.5–5.5)
BUN/Creatinine Ratio: 10 (ref 8–20)
BUN: 5 mg/dL — ABNORMAL LOW (ref 6–20)
Bilirubin Total: 0.4 mg/dL (ref 0.0–1.2)
CHLORIDE: 104 mmol/L (ref 96–106)
CO2: 22 mmol/L (ref 18–29)
Calcium: 8.6 mg/dL — ABNORMAL LOW (ref 8.7–10.2)
Creatinine, Ser: 0.52 mg/dL — ABNORMAL LOW (ref 0.57–1.00)
GFR calc Af Amer: 154 mL/min/{1.73_m2} (ref >59)
GFR calc non Af Amer: 133 mL/min/{1.73_m2} (ref >59)
GLOBULIN, TOTAL: 2.4 g/dL (ref 1.5–4.5)
Glucose: 100 mg/dL — ABNORMAL HIGH (ref 65–99)
POTASSIUM: 3.6 mmol/L (ref 3.5–5.2)
SODIUM: 138 mmol/L (ref 134–144)
Total Protein: 5.7 g/dL — ABNORMAL LOW (ref 6.0–8.5)

## 2015-06-14 LAB — BILE ACIDS, TOTAL: Bile Acids Total: 9.1 umol/L (ref 4.7–24.5)

## 2015-06-14 LAB — RPR: RPR: NONREACTIVE

## 2015-06-14 LAB — ANTIBODY SCREEN: ANTIBODY SCREEN: NEGATIVE

## 2015-06-14 LAB — HIV ANTIBODY (ROUTINE TESTING W REFLEX): HIV SCREEN 4TH GENERATION: NONREACTIVE

## 2015-06-14 NOTE — Progress Notes (Signed)
[place] 28+3wks,trans head lt,cx 4.9cm,post pl gr 0,fhr 144 bpm,normal ov's bilat,afi 14.4cm,efw 1231g

## 2015-06-15 ENCOUNTER — Encounter (HOSPITAL_COMMUNITY): Payer: Self-pay | Admitting: *Deleted

## 2015-06-15 ENCOUNTER — Inpatient Hospital Stay (HOSPITAL_COMMUNITY)
Admission: AD | Admit: 2015-06-15 | Discharge: 2015-06-15 | Disposition: A | Discharge status: Home / Self Care | Payer: Medicaid Other | Source: Ambulatory Visit | Attending: Obstetrics and Gynecology | Admitting: Obstetrics and Gynecology

## 2015-06-15 DIAGNOSIS — O10912 Unspecified pre-existing hypertension complicating pregnancy, second trimester: Secondary | ICD-10-CM | POA: Diagnosis not present

## 2015-06-15 DIAGNOSIS — Z87891 Personal history of nicotine dependence: Secondary | ICD-10-CM | POA: Insufficient documentation

## 2015-06-15 DIAGNOSIS — I10 Essential (primary) hypertension: Secondary | ICD-10-CM | POA: Diagnosis present

## 2015-06-15 DIAGNOSIS — O10919 Unspecified pre-existing hypertension complicating pregnancy, unspecified trimester: Secondary | ICD-10-CM | POA: Insufficient documentation

## 2015-06-15 DIAGNOSIS — Z3A28 28 weeks gestation of pregnancy: Secondary | ICD-10-CM | POA: Insufficient documentation

## 2015-06-15 LAB — CBC
HCT: 34.6 % — ABNORMAL LOW (ref 36.0–46.0)
Hemoglobin: 11.6 g/dL — ABNORMAL LOW (ref 12.0–15.0)
MCH: 29 pg (ref 26.0–34.0)
MCHC: 33.5 g/dL (ref 30.0–36.0)
MCV: 86.5 fL (ref 78.0–100.0)
Platelets: 265 10*3/uL (ref 150–400)
RBC: 4 MIL/uL (ref 3.87–5.11)
RDW: 13 % (ref 11.5–15.5)
WBC: 10.4 10*3/uL (ref 4.0–10.5)

## 2015-06-15 LAB — COMPREHENSIVE METABOLIC PANEL
ALT: 27 U/L (ref 14–54)
AST: 18 U/L (ref 15–41)
Albumin: 2.9 g/dL — ABNORMAL LOW (ref 3.5–5.0)
Alkaline Phosphatase: 109 U/L (ref 38–126)
Anion gap: 10 (ref 5–15)
BUN: 6 mg/dL (ref 6–20)
CO2: 22 mmol/L (ref 22–32)
Calcium: 8.5 mg/dL — ABNORMAL LOW (ref 8.9–10.3)
Chloride: 103 mmol/L (ref 101–111)
Creatinine, Ser: 0.49 mg/dL (ref 0.44–1.00)
GFR calc Af Amer: >60 mL/min (ref >60)
GFR calc non Af Amer: >60 mL/min (ref >60)
Glucose, Bld: 82 mg/dL (ref 65–99)
Potassium: 3.8 mmol/L (ref 3.5–5.1)
Sodium: 135 mmol/L (ref 135–145)
Total Bilirubin: 0.6 mg/dL (ref 0.3–1.2)
Total Protein: 6.2 g/dL — ABNORMAL LOW (ref 6.5–8.1)

## 2015-06-15 LAB — URINALYSIS, ROUTINE W REFLEX MICROSCOPIC
BILIRUBIN URINE: NEGATIVE
Glucose, UA: NEGATIVE mg/dL
Hgb urine dipstick: NEGATIVE
Ketones, ur: NEGATIVE mg/dL
Leukocytes, UA: NEGATIVE
NITRITE: NEGATIVE
PROTEIN: NEGATIVE mg/dL
SPECIFIC GRAVITY, URINE: 1.01 (ref 1.005–1.030)
pH: 6.5 (ref 5.0–8.0)

## 2015-06-15 LAB — PROTEIN / CREATININE RATIO, URINE
Creatinine, Urine: 42 mg/dL
Total Protein, Urine: <6 mg/dL

## 2015-06-15 NOTE — Discharge Instructions (Signed)

## 2015-06-15 NOTE — MAU Note (Signed)
Pt presents to MAU with complaints of swelling in her hands and feet with an increase in blood pressure. Pt states she took her blood pressure at home. Denies any vaginal bleeding or LOF

## 2015-06-15 NOTE — MAU Provider Note (Signed)
History     CSN: [id number]  Arrival date and time: 06/15/15 1717   First Provider Initiated Contact with Patient 06/15/15 1831      Chief Complaint  Patient presents with  . Hypertension   HPI  Sara Vargas 25 y.o. G1P0 @ [redacted]w[redacted]d presents to MAU after being told by the office to come and have her blood pressure checked. She has a history of hypertension but does not take medications for this during her pregnancy. She denies headache, epigastric pain, visual disturbances.  Past Medical History  Diagnosis Date  . Hypertension     no meds    Past Surgical History  Procedure Laterality Date  . Tonsillectomy    . Arm surgery    . Ureteral exploration      Family History  Problem Relation Age of Onset  . Heart disease Mother   . COPD Mother   . Asthma Mother   . Other Father     benign brain tumor  . Heart disease Maternal Grandmother     Social History  Substance Use Topics  . Smoking status: Former Smoker -- 6.00 packs/day    Types: Cigarettes  . Smokeless tobacco: Never Used  . Alcohol Use: No    Allergies: No Known Allergies  Prescriptions prior to admission  Medication Sig Dispense Refill Last Dose  . acetaminophen (TYLENOL) 500 MG tablet Take 1,000 mg by mouth every 6 (six) hours as needed for mild pain. Reported on 06/13/2015   Past Month at Unknown time  . aspirin 81 MG tablet Take 81 mg by mouth daily. Reported on 06/13/2015   06/15/2015 at Unknown time  . Prenatal Vit-Fe Fumarate-FA (MULTIVITAMIN-PRENATAL) 27-0.8 MG TABS tablet Take 1 tablet by mouth daily at 12 noon. 30 each 11 06/15/2015 at Unknown time    Review of Systems  Constitutional: Negative for fever.  Eyes: Negative for blurred vision.  Gastrointestinal: Negative for nausea, vomiting and abdominal pain.  Neurological: Negative for headaches.  All other systems reviewed and are negative.  Physical Exam   Blood pressure 153/84, pulse 90, temperature 97.5 F (36.4 C), resp. rate 18, last  menstrual period 10/18/2014.  Physical Exam  Nursing note and vitals reviewed. Constitutional: She is oriented to person, place, and time. She appears well-developed and well-nourished. No distress.  HENT:  Head: Normocephalic and atraumatic.  Cardiovascular: Normal rate.   Respiratory: Effort normal. No respiratory distress.  GI: Soft. She exhibits no distension and no mass. There is no tenderness. There is no rebound and no guarding.  Musculoskeletal: Normal range of motion.  Neurological: She is alert and oriented to person, place, and time.  Skin: Skin is warm and dry.  Psychiatric: She has a normal mood and affect. Her behavior is normal. Judgment and thought content normal.   Results for orders placed or performed during the hospital encounter of 06/15/15 (from the past 24 hour(s))  Urinalysis, Routine w reflex microscopic (not at [facility])     Status: None   Collection Time: 06/15/15  5:40 PM  Result Value Ref Range   Color, Urine YELLOW YELLOW   APPearance CLEAR CLEAR   Specific Gravity, Urine 1.010 1.005 - 1.030   pH 6.5 5.0 - 8.0   Glucose, UA NEGATIVE NEGATIVE mg/dL   Hgb urine dipstick NEGATIVE NEGATIVE   Bilirubin Urine NEGATIVE NEGATIVE   Ketones, ur NEGATIVE NEGATIVE mg/dL   Protein, ur NEGATIVE NEGATIVE mg/dL   Nitrite NEGATIVE NEGATIVE   Leukocytes, UA NEGATIVE NEGATIVE  Protein /  creatinine ratio, urine     Status: None   Collection Time: 06/15/15  5:40 PM  Result Value Ref Range   Creatinine, Urine 42.00 mg/dL   Total Protein, Urine <6 mg/dL   Protein Creatinine Ratio        0.00 - 0.15 mg/mg[Cre]  CBC     Status: Abnormal   Collection Time: 06/15/15  6:18 PM  Result Value Ref Range   WBC 10.4 4.0 - 10.5 K/uL   RBC 4.00 3.87 - 5.11 MIL/uL   Hemoglobin 11.6 (L) 12.0 - 15.0 g/dL   HCT 16.1 (L) 09.6 - 04.5 %   MCV 86.5 78.0 - 100.0 fL   MCH 29.0 26.0 - 34.0 pg   MCHC 33.5 30.0 - 36.0 g/dL   RDW 40.9 81.1 - 91.4 %   Platelets 265 150 - 400 K/uL   Comprehensive metabolic panel     Status: Abnormal   Collection Time: 06/15/15  6:18 PM  Result Value Ref Range   Sodium 135 135 - 145 mmol/L   Potassium 3.8 3.5 - 5.1 mmol/L   Chloride 103 101 - 111 mmol/L   CO2 22 22 - 32 mmol/L   Glucose, Bld 82 65 - 99 mg/dL   BUN 6 6 - 20 mg/dL   Creatinine, Ser 7.82 0.44 - 1.00 mg/dL   Calcium 8.5 (L) 8.9 - 10.3 mg/dL   Total Protein 6.2 (L) 6.5 - 8.1 g/dL   Albumin 2.9 (L) 3.5 - 5.0 g/dL   AST 18 15 - 41 U/L   ALT 27 14 - 54 U/L   Alkaline Phosphatase 109 38 - 126 U/L   Total Bilirubin 0.6 0.3 - 1.2 mg/dL   GFR calc non Af Amer >60 >60 mL/min   GFR calc Af Amer >60 >60 mL/min   Anion gap 10 5 - 15   06/15/15 1826  --  90  --  --  153/84 mmHg  --  --  --  -- KW     06/15/15 1809  --  89  --  --  139/68 mmHg  --  --  --  -- KW    06/15/15 1743  97.5 F (36.4 C)  118  --  18  131/76 mmHg  --  --  --  --      MAU Course  Procedures  MDM Pre E labs are all normal. Pt is to follow up in the office at [facility] at regular scheduled appt.  Assessment and Plan  Chronic hypertension in Pregnancy  Discharge  Sara Vargas 06/15/2015, 6:47 PM

## 2015-06-27 ENCOUNTER — Ambulatory Visit (INDEPENDENT_AMBULATORY_CARE_PROVIDER_SITE_OTHER): Payer: Medicaid Other | Admitting: Obstetrics and Gynecology

## 2015-06-27 ENCOUNTER — Encounter: Payer: Self-pay | Admitting: Obstetrics and Gynecology

## 2015-06-27 VITALS — BP 166/92 | HR 78 | Wt 366.0 lb

## 2015-06-27 DIAGNOSIS — Z1389 Encounter for screening for other disorder: Secondary | ICD-10-CM

## 2015-06-27 DIAGNOSIS — O10912 Unspecified pre-existing hypertension complicating pregnancy, second trimester: Secondary | ICD-10-CM

## 2015-06-27 DIAGNOSIS — O09893 Supervision of other high risk pregnancies, third trimester: Secondary | ICD-10-CM

## 2015-06-27 DIAGNOSIS — Z331 Pregnant state, incidental: Secondary | ICD-10-CM | POA: Diagnosis not present

## 2015-06-27 DIAGNOSIS — O99213 Obesity complicating pregnancy, third trimester: Secondary | ICD-10-CM

## 2015-06-27 DIAGNOSIS — O10913 Unspecified pre-existing hypertension complicating pregnancy, third trimester: Secondary | ICD-10-CM | POA: Diagnosis not present

## 2015-06-27 DIAGNOSIS — Z3A30 30 weeks gestation of pregnancy: Secondary | ICD-10-CM | POA: Diagnosis not present

## 2015-06-27 LAB — POCT URINALYSIS DIPSTICK
Blood, UA: NEGATIVE
Glucose, UA: NEGATIVE
Ketones, UA: NEGATIVE
LEUKOCYTES UA: NEGATIVE
Nitrite, UA: NEGATIVE
PROTEIN UA: NEGATIVE

## 2015-06-27 MED ORDER — LABETALOL HCL 200 MG PO TABS: 200.0000 mg | ORAL_TABLET | Freq: Two times a day (BID) | ORAL | Status: DC

## 2015-06-27 NOTE — Progress Notes (Signed)
Patient ID: Sara Vargas, female   DOB: [birth year], 25 y.o.   MRN: [id number]   High Risk Pregnancy Diagnosis(es):   CHTN not on meds previously  G1P0 [redacted]w[redacted]d Estimated Date of Delivery: 09/03/15     HPI: The patient is being seen today for ongoing management of CHTN. prevously no med tx during this preg. Today she reports no complaints, although she has concerns about her blood pressure. She states she was on hypertension medications 3 years ago but had stopped taking it after losing weight. She states she was also placed on hypertension medication earlier this year during a stressful time, but stopped taking it after a while because she "felt better." Patient states she takes a daily aspirin.  Patient reports good fetal movement, denies any bleeding and no rupture of membranes symptoms or regular contractions.   BP weight and urine results all reviewed and noted. Blood pressure 166/92, pulse 78, weight 366 lb (166.017 kg), last menstrual period 10/18/2014.  Fetal Surveillance Testing today:  none Fundal Height:  40 cm Fetal Heart rate:  130 bpm Edema:  N/a  reflexes 1+  Urinalysis: Negative   Questions were answered.  Lab and sonogram results have been reviewed. Comments: not done   Assessment:  1.  Pregnancy at [redacted]w[redacted]d,  Estimated Date of Delivery: 09/03/15 :                          2.  Chronic HTN, uncontrolled                         3 obesity uncontrolled                        3.  Collect 24-hour urine protein test this week  Medication(s) Plans:  200 mg labetalol BID  Treatment Plan:  As above  Follow up in 1 weeks for appointment for high risk OB care, re-assess  Chronic HTN    By signing my name below, I, Ronney Lion, attest that this documentation has been prepared under the direction and in the presence of Tilda Burrow, MD. Electronically Signed: Ronney Lion, ED Scribe. 06/27/2015. 11:33 AM.  I personally performed the services described in this documentation,  which was SCRIBED in my presence. The recorded information has been reviewed and considered accurate. It has been edited as necessary during review. Tilda Burrow, MD

## 2015-06-27 NOTE — Progress Notes (Signed)
Pt denies any problems or concerns at this time.  

## 2015-07-04 ENCOUNTER — Ambulatory Visit (INDEPENDENT_AMBULATORY_CARE_PROVIDER_SITE_OTHER): Payer: Medicaid Other | Admitting: Obstetrics and Gynecology

## 2015-07-04 ENCOUNTER — Encounter: Payer: Self-pay | Admitting: Obstetrics and Gynecology

## 2015-07-04 VITALS — BP 128/76 | HR 86 | Wt 363.0 lb

## 2015-07-04 DIAGNOSIS — O10912 Unspecified pre-existing hypertension complicating pregnancy, second trimester: Secondary | ICD-10-CM | POA: Diagnosis not present

## 2015-07-04 DIAGNOSIS — Z331 Pregnant state, incidental: Secondary | ICD-10-CM | POA: Diagnosis not present

## 2015-07-04 DIAGNOSIS — O99213 Obesity complicating pregnancy, third trimester: Secondary | ICD-10-CM

## 2015-07-04 DIAGNOSIS — O09893 Supervision of other high risk pregnancies, third trimester: Secondary | ICD-10-CM | POA: Diagnosis not present

## 2015-07-04 DIAGNOSIS — Z1389 Encounter for screening for other disorder: Secondary | ICD-10-CM

## 2015-07-04 DIAGNOSIS — O10919 Unspecified pre-existing hypertension complicating pregnancy, unspecified trimester: Secondary | ICD-10-CM

## 2015-07-04 LAB — POCT URINALYSIS DIPSTICK
Glucose, UA: NEGATIVE
Ketones, UA: NEGATIVE
Leukocytes, UA: NEGATIVE
NITRITE UA: NEGATIVE
PROTEIN UA: NEGATIVE
RBC UA: NEGATIVE

## 2015-07-04 NOTE — Progress Notes (Signed)
Patient ID: Sara G AkeefALEKSIS JIGGETTSDOB: 05-21-1991, 25 y.o.   MRN: [id number]  High Risk Pregnancy Diagnosis(es):   CHTN not on meds previously  G1P0 [redacted]w[redacted]d Estimated Date of Delivery: 09/03/15    HPI: The patient is being seen today for ongoing management of high risk pregnancy due to [facility]. Today she reports no complaints.  Patient reports good fetal movement, denies any bleeding and no rupture of membranes symptoms or regular contractions.   BP weight and urine results all reviewed and noted. Blood pressure 128/76, pulse 86, weight 363 lb (164.656 kg), last menstrual period 10/18/2014.  Fetal Surveillance Testing today:  NA Fundal Height:  42cm Fetal Heart rate:  159 Edema:  1+ pitting BLE   Urinalysis: Negative   Questions were answered.  Lab and sonogram results have been reviewed. Comments: not done   Assessment:  1.  Pregnancy at [redacted]w[redacted]d,  Estimated Date of Delivery: 09/03/15 :                          2.  Chronic HTN, stable                         3.  Morbid obesity, stable   4. Begin 2x a week visit and testing starting [redacted]w[redacted]d.   Medication(s) Plans:  No changes  Treatment Plan:  Begin 2x a week visit and testing starting [redacted]w[redacted]d.  Follow up in 1 weeks for appointment for high risk OB care  By signing my name below, I, Marica Otter, attest that this documentation has been prepared under the direction and in the presence of Christin Bach, MD. Electronically Signed: Marica Otter, ED Scribe. 07/04/2015. 12:17 PM.   I personally performed the services described in this documentation, which was SCRIBED in my presence. The recorded information has been reviewed and considered accurate. It has been edited as necessary during review. Tilda Burrow, MD

## 2015-07-04 NOTE — Progress Notes (Signed)
Pt denies any problems or concerns at this time.  

## 2015-07-05 ENCOUNTER — Other Ambulatory Visit: Payer: Medicaid Other | Admitting: Obstetrics and Gynecology

## 2015-07-06 LAB — PROTEIN, URINE, 24 HOUR
PROTEIN 24H UR: 111.2 mg/(24.h) (ref 30.0–150.0)
Protein, Ur: 11.7 mg/dL

## 2015-07-12 ENCOUNTER — Ambulatory Visit (INDEPENDENT_AMBULATORY_CARE_PROVIDER_SITE_OTHER): Payer: Medicaid Other | Admitting: Obstetrics and Gynecology

## 2015-07-12 ENCOUNTER — Encounter: Payer: Self-pay | Admitting: Obstetrics and Gynecology

## 2015-07-12 VITALS — BP 140/86 | HR 86 | Wt 366.0 lb

## 2015-07-12 DIAGNOSIS — Z1389 Encounter for screening for other disorder: Secondary | ICD-10-CM

## 2015-07-12 DIAGNOSIS — O289 Unspecified abnormal findings on antenatal screening of mother: Secondary | ICD-10-CM

## 2015-07-12 DIAGNOSIS — O10919 Unspecified pre-existing hypertension complicating pregnancy, unspecified trimester: Secondary | ICD-10-CM

## 2015-07-12 DIAGNOSIS — O09893 Supervision of other high risk pregnancies, third trimester: Secondary | ICD-10-CM | POA: Diagnosis not present

## 2015-07-12 DIAGNOSIS — O280 Abnormal hematological finding on antenatal screening of mother: Secondary | ICD-10-CM

## 2015-07-12 DIAGNOSIS — Z331 Pregnant state, incidental: Secondary | ICD-10-CM

## 2015-07-12 DIAGNOSIS — O10912 Unspecified pre-existing hypertension complicating pregnancy, second trimester: Secondary | ICD-10-CM

## 2015-07-12 DIAGNOSIS — Z3A33 33 weeks gestation of pregnancy: Secondary | ICD-10-CM

## 2015-07-12 DIAGNOSIS — O99212 Obesity complicating pregnancy, second trimester: Secondary | ICD-10-CM

## 2015-07-12 DIAGNOSIS — O285 Abnormal chromosomal and genetic finding on antenatal screening of mother: Secondary | ICD-10-CM

## 2015-07-12 LAB — POCT URINALYSIS DIPSTICK
GLUCOSE UA: NEGATIVE
KETONES UA: NEGATIVE
Leukocytes, UA: NEGATIVE
Nitrite, UA: NEGATIVE
RBC UA: NEGATIVE

## 2015-07-12 NOTE — Progress Notes (Signed)
Pt denies any problems or concerns at this time.  

## 2015-07-12 NOTE — Progress Notes (Signed)
High Risk Pregnancy Diagnosis(es):   Chtn, morbid obesity  Blood pressure 140/86, pulse 86, weight 366 lb (166.017 kg), last menstrual period 10/18/2014. HPI: G79P0 [redacted]w[redacted]d Estimated Date of Delivery: 09/03/15     Having lots of pressure. Hasn't taken her AM bp meds     BP weight and urine results reviewed and notable for 140/86. Patient reports    good fetal movement, denies any bleeding and no rupture of membranes symptoms or regular contractions .  Fundal Height:  42 Fetal Heart rate:  150 Edema:  1+ Urinalysis: Negative  .Assessment HROB :G1P0  @ [redacted]w[redacted]d,   Reactive nst, chtn missing her meds this am.   Medication(s) Plans:  Compliance emphasized  Treatment Plan:        Biweekly testing bpp/nst til 39 wk iol Follow up:           0.5 weeks for  bpp All questions were answered.

## 2015-07-13 ENCOUNTER — Other Ambulatory Visit: Payer: Self-pay | Admitting: Obstetrics and Gynecology

## 2015-07-13 DIAGNOSIS — O10913 Unspecified pre-existing hypertension complicating pregnancy, third trimester: Secondary | ICD-10-CM

## 2015-07-16 ENCOUNTER — Ambulatory Visit (INDEPENDENT_AMBULATORY_CARE_PROVIDER_SITE_OTHER): Payer: Medicaid Other

## 2015-07-16 ENCOUNTER — Ambulatory Visit (INDEPENDENT_AMBULATORY_CARE_PROVIDER_SITE_OTHER): Payer: Medicaid Other | Admitting: Obstetrics & Gynecology

## 2015-07-16 ENCOUNTER — Encounter: Payer: Medicaid Other | Admitting: Women's Health

## 2015-07-16 ENCOUNTER — Encounter: Payer: Self-pay | Admitting: Obstetrics & Gynecology

## 2015-07-16 ENCOUNTER — Other Ambulatory Visit: Payer: Medicaid Other

## 2015-07-16 VITALS — BP 140/80 | HR 74 | Wt 365.8 lb

## 2015-07-16 DIAGNOSIS — O99213 Obesity complicating pregnancy, third trimester: Secondary | ICD-10-CM | POA: Diagnosis not present

## 2015-07-16 DIAGNOSIS — Z331 Pregnant state, incidental: Secondary | ICD-10-CM | POA: Diagnosis not present

## 2015-07-16 DIAGNOSIS — Z1389 Encounter for screening for other disorder: Secondary | ICD-10-CM

## 2015-07-16 DIAGNOSIS — O09893 Supervision of other high risk pregnancies, third trimester: Secondary | ICD-10-CM

## 2015-07-16 DIAGNOSIS — O280 Abnormal hematological finding on antenatal screening of mother: Secondary | ICD-10-CM

## 2015-07-16 DIAGNOSIS — Z3A33 33 weeks gestation of pregnancy: Secondary | ICD-10-CM

## 2015-07-16 DIAGNOSIS — O10913 Unspecified pre-existing hypertension complicating pregnancy, third trimester: Secondary | ICD-10-CM

## 2015-07-16 DIAGNOSIS — Z6841 Body Mass Index (BMI) 40.0 and over, adult: Secondary | ICD-10-CM

## 2015-07-16 DIAGNOSIS — O10919 Unspecified pre-existing hypertension complicating pregnancy, unspecified trimester: Secondary | ICD-10-CM

## 2015-07-16 LAB — POCT URINALYSIS DIPSTICK
GLUCOSE UA: NEGATIVE
Ketones, UA: NEGATIVE
Leukocytes, UA: NEGATIVE
NITRITE UA: NEGATIVE
PROTEIN UA: NEGATIVE
RBC UA: NEGATIVE

## 2015-07-16 NOTE — Progress Notes (Signed)
Fetal Surveillance Testing today:  BPP  8/8 Dopplers excellent   High Risk Pregnancy Diagnosis(es):   Chronic hypertension  G1P0 [redacted]w[redacted]d Estimated Date of Delivery: 09/03/15  Blood pressure 140/80, pulse 74, weight 365 lb 12.8 oz (165.926 kg), last menstrual period 10/18/2014.  Urinalysis: Negative   HPI: The patient is being seen today for ongoing management of chronic hypertension. Today she reports no problems   BP weight and urine results all reviewed and noted. Patient reports good fetal movement, denies any bleeding and no rupture of membranes symptoms or regular contractions.  Fundal Height:  45 Fetal Heart rate:  138 Edema:  none  Patient is without complaints other than noted in her HPI. All questions were answered.  All lab and sonogram results have been reviewed. Comments: abnormal:    Assessment:  1.  Pregnancy at [redacted]w[redacted]d,  Estimated Date of Delivery: 09/03/15 :                          2.  Chronic hypertension                        3.    Medication(s) Plans:  Cont labetalol 200 BID  Treatment Plan:  Twice weekly surveillance  No Follow-up on file. for appointment for high risk OB care  No orders of the defined types were placed in this encounter.   Orders Placed This Encounter  Procedures  . POCT urinalysis dipstick

## 2015-07-16 NOTE — Progress Notes (Signed)
[place] 33wks,trans head rt,post pl gr 1,bilat adnexa's wnl,BPP 8/8,efw 2111g 47%,afi 18%,[place] .55,.45,FHR 138 BPM

## 2015-07-19 ENCOUNTER — Encounter: Payer: Self-pay | Admitting: Obstetrics & Gynecology

## 2015-07-19 ENCOUNTER — Ambulatory Visit (INDEPENDENT_AMBULATORY_CARE_PROVIDER_SITE_OTHER): Payer: Medicaid Other | Admitting: Obstetrics & Gynecology

## 2015-07-19 VITALS — BP 130/80 | HR 84 | Wt 366.6 lb

## 2015-07-19 DIAGNOSIS — Z1389 Encounter for screening for other disorder: Secondary | ICD-10-CM

## 2015-07-19 DIAGNOSIS — Z3A34 34 weeks gestation of pregnancy: Secondary | ICD-10-CM | POA: Diagnosis not present

## 2015-07-19 DIAGNOSIS — O99213 Obesity complicating pregnancy, third trimester: Secondary | ICD-10-CM

## 2015-07-19 DIAGNOSIS — O10919 Unspecified pre-existing hypertension complicating pregnancy, unspecified trimester: Secondary | ICD-10-CM

## 2015-07-19 DIAGNOSIS — O09893 Supervision of other high risk pregnancies, third trimester: Secondary | ICD-10-CM

## 2015-07-19 DIAGNOSIS — Z331 Pregnant state, incidental: Secondary | ICD-10-CM

## 2015-07-19 DIAGNOSIS — O10912 Unspecified pre-existing hypertension complicating pregnancy, second trimester: Secondary | ICD-10-CM | POA: Diagnosis not present

## 2015-07-19 LAB — POCT URINALYSIS DIPSTICK
Blood, UA: NEGATIVE
Glucose, UA: NEGATIVE
KETONES UA: NEGATIVE
LEUKOCYTES UA: NEGATIVE
Nitrite, UA: NEGATIVE
PROTEIN UA: NEGATIVE

## 2015-07-19 NOTE — Progress Notes (Signed)
Fetal Surveillance Testing today:  Reactive NST   High Risk Pregnancy Diagnosis(es):   Chronic hypertension  G1P0 [redacted]w[redacted]d Estimated Date of Delivery: 09/03/15  Blood pressure 130/80, pulse 84, weight 366 lb 9.6 oz (166.289 kg), last menstrual period 10/18/2014.  Urinalysis: Negative   HPI: The patient is being seen today for ongoing management of chronic hypertension. Today she reports carpal tunnel syndrome   BP weight and urine results all reviewed and noted. Patient reports good fetal movement, denies any bleeding and no rupture of membranes symptoms or regular contractions.  Fundal Height:  U+22 Fetal Heart rate:  135 Edema:  none  Patient is without complaints other than noted in her HPI. All questions were answered.  All lab and sonogram results have been reviewed. Comments: abnormal:    Assessment:  1.  Pregnancy at [redacted]w[redacted]d,  Estimated Date of Delivery: 09/03/15 :                          2.  Chronic hypertension                        3.  Morbid obesity  Medication(s) Plans:  Labetalol 200 BID + baby ASA  Treatment Plan:  Twice weekly surveillance, sono alt NST, induce 39 weeks  No Follow-up on file. for appointment for high risk OB care  No orders of the defined types were placed in this encounter.   Orders Placed This Encounter  Procedures  . POCT urinalysis dipstick

## 2015-07-20 ENCOUNTER — Other Ambulatory Visit: Payer: Self-pay | Admitting: Obstetrics & Gynecology

## 2015-07-20 DIAGNOSIS — O10913 Unspecified pre-existing hypertension complicating pregnancy, third trimester: Secondary | ICD-10-CM

## 2015-07-23 ENCOUNTER — Other Ambulatory Visit: Payer: Self-pay | Admitting: Obstetrics & Gynecology

## 2015-07-23 ENCOUNTER — Ambulatory Visit: Payer: Medicaid Other

## 2015-07-23 ENCOUNTER — Other Ambulatory Visit: Payer: Medicaid Other

## 2015-07-23 ENCOUNTER — Ambulatory Visit (INDEPENDENT_AMBULATORY_CARE_PROVIDER_SITE_OTHER): Payer: Medicaid Other

## 2015-07-23 ENCOUNTER — Ambulatory Visit (INDEPENDENT_AMBULATORY_CARE_PROVIDER_SITE_OTHER): Payer: Medicaid Other | Admitting: Obstetrics & Gynecology

## 2015-07-23 ENCOUNTER — Encounter: Payer: Self-pay | Admitting: Obstetrics & Gynecology

## 2015-07-23 VITALS — BP 130/70 | HR 74 | Wt 369.8 lb

## 2015-07-23 DIAGNOSIS — O10913 Unspecified pre-existing hypertension complicating pregnancy, third trimester: Secondary | ICD-10-CM

## 2015-07-23 DIAGNOSIS — Z1389 Encounter for screening for other disorder: Secondary | ICD-10-CM

## 2015-07-23 DIAGNOSIS — Z3A34 34 weeks gestation of pregnancy: Secondary | ICD-10-CM | POA: Diagnosis not present

## 2015-07-23 DIAGNOSIS — O09893 Supervision of other high risk pregnancies, third trimester: Secondary | ICD-10-CM | POA: Diagnosis not present

## 2015-07-23 DIAGNOSIS — O10919 Unspecified pre-existing hypertension complicating pregnancy, unspecified trimester: Secondary | ICD-10-CM

## 2015-07-23 DIAGNOSIS — Z331 Pregnant state, incidental: Secondary | ICD-10-CM

## 2015-07-23 DIAGNOSIS — O10912 Unspecified pre-existing hypertension complicating pregnancy, second trimester: Secondary | ICD-10-CM

## 2015-07-23 LAB — POCT URINALYSIS DIPSTICK
GLUCOSE UA: NEGATIVE
Ketones, UA: NEGATIVE
LEUKOCYTES UA: NEGATIVE
NITRITE UA: NEGATIVE
Protein, UA: NEGATIVE
RBC UA: NEGATIVE

## 2015-07-23 NOTE — Progress Notes (Signed)
[place] 34wks,post pl gr 1,RI .61,.57,AFI 15.4cm,bilat adnexa's wnl,BPP 8/8,FHR 148 BPM,cephalic

## 2015-07-23 NOTE — Progress Notes (Signed)
Fetal Surveillance Testing today:  BPP 8/8 with excellent Dopplers   High Risk Pregnancy Diagnosis(es):   Chronic hypertension, elevated Quad screen  G1P0 [redacted]w[redacted]d Estimated Date of Delivery: 09/03/15  Blood pressure 130/70, pulse 74, weight 369 lb 12.8 oz (167.74 kg), last menstrual period 10/18/2014.  Urinalysis: Negative   HPI: The patient is being seen today for ongoing management of chronic hypertension. Today she reports no complaints   BP weight and urine results all reviewed and noted. Patient reports good fetal movement, denies any bleeding and no rupture of membranes symptoms or regular contractions.  Fundal Height:  45 Fetal Heart rate:  140 Edema:  1+  Patient is without complaints other than noted in her HPI. All questions were answered.  All lab and sonogram results have been reviewed. Comments: abnormal: chronic hypertension   Assessment:  1.  Pregnancy at [redacted]w[redacted]d,  Estimated Date of Delivery: 09/03/15 :                          2.  Chronic hypertension                        3.  Elevated quad screen  Medication(s) Plans:  Labetalol 200 BID  Treatment Plan:  Twice weekly surveillance, alt sono with NST, induction 39 weeks  No Follow-up on file. for appointment for high risk OB care  No orders of the defined types were placed in this encounter.   Orders Placed This Encounter  Procedures  . POCT urinalysis dipstick

## 2015-07-26 ENCOUNTER — Encounter: Payer: Self-pay | Admitting: Obstetrics & Gynecology

## 2015-07-26 ENCOUNTER — Ambulatory Visit (INDEPENDENT_AMBULATORY_CARE_PROVIDER_SITE_OTHER): Payer: Medicaid Other | Admitting: Obstetrics & Gynecology

## 2015-07-26 VITALS — BP 110/60 | HR 80 | Wt 370.0 lb

## 2015-07-26 DIAGNOSIS — Z1389 Encounter for screening for other disorder: Secondary | ICD-10-CM

## 2015-07-26 DIAGNOSIS — O09893 Supervision of other high risk pregnancies, third trimester: Secondary | ICD-10-CM | POA: Diagnosis not present

## 2015-07-26 DIAGNOSIS — O280 Abnormal hematological finding on antenatal screening of mother: Secondary | ICD-10-CM

## 2015-07-26 DIAGNOSIS — Z331 Pregnant state, incidental: Secondary | ICD-10-CM

## 2015-07-26 DIAGNOSIS — O10919 Unspecified pre-existing hypertension complicating pregnancy, unspecified trimester: Secondary | ICD-10-CM

## 2015-07-26 DIAGNOSIS — O10912 Unspecified pre-existing hypertension complicating pregnancy, second trimester: Secondary | ICD-10-CM | POA: Diagnosis not present

## 2015-07-26 DIAGNOSIS — O10913 Unspecified pre-existing hypertension complicating pregnancy, third trimester: Secondary | ICD-10-CM

## 2015-07-26 LAB — POCT URINALYSIS DIPSTICK
Blood, UA: NEGATIVE
Glucose, UA: NEGATIVE
KETONES UA: NEGATIVE
Leukocytes, UA: NEGATIVE
Nitrite, UA: NEGATIVE
Protein, UA: NEGATIVE

## 2015-07-26 NOTE — Progress Notes (Signed)
Fetal Surveillance Testing today:  Reactive NST   High Risk Pregnancy Diagnosis(es):   Chronic hypertension  G1P0 [redacted]w[redacted]d Estimated Date of Delivery: 09/03/15  Blood pressure 110/60, pulse 80, weight 370 lb (167.831 kg), last menstrual period 10/18/2014.  Urinalysis: Negative   HPI: The patient is being seen today for ongoing management of chronic hypertension. Today she reports no complaints   BP weight and urine results all reviewed and noted. Patient reports good fetal movement, denies any bleeding and no rupture of membranes symptoms or regular contractions.  Fundal Height:  U+24 Fetal Heart rate:  140 Edema:  2+  Patient is without complaints other than noted in her HPI. All questions were answered.  All lab and sonogram results have been reviewed. Comments:    Assessment:  1.  Pregnancy at [redacted]w[redacted]d,  Estimated Date of Delivery: 09/03/15 :                          2.  Chronic hypertension                        3.  Elevated quad screen AFP  Medication(s) Plans:  No changes labetalol 200 BID, ASA  Treatment Plan:  Twice weekly surveillance with sono and NST alternating  Return in about 4 days (around 07/30/2015) for BPP/sono, HROB. for appointment for high risk OB care  No orders of the defined types were placed in this encounter.   Orders Placed This Encounter  Procedures  . [place] UA Cord Doppler  . US Fetal BPP W/O Non Stress  . POCT urinalysis dipstick

## 2015-07-27 ENCOUNTER — Other Ambulatory Visit: Payer: Self-pay | Admitting: Obstetrics & Gynecology

## 2015-07-27 DIAGNOSIS — O10919 Unspecified pre-existing hypertension complicating pregnancy, unspecified trimester: Secondary | ICD-10-CM

## 2015-07-27 DIAGNOSIS — O280 Abnormal hematological finding on antenatal screening of mother: Secondary | ICD-10-CM

## 2015-07-30 ENCOUNTER — Ambulatory Visit (INDEPENDENT_AMBULATORY_CARE_PROVIDER_SITE_OTHER): Payer: Medicaid Other

## 2015-07-30 ENCOUNTER — Encounter: Payer: Self-pay | Admitting: Obstetrics and Gynecology

## 2015-07-30 ENCOUNTER — Ambulatory Visit (INDEPENDENT_AMBULATORY_CARE_PROVIDER_SITE_OTHER): Payer: Medicaid Other | Admitting: Obstetrics and Gynecology

## 2015-07-30 VITALS — BP 122/70 | HR 86 | Wt 371.5 lb

## 2015-07-30 DIAGNOSIS — O321XX1 Maternal care for breech presentation, fetus 1: Secondary | ICD-10-CM | POA: Diagnosis not present

## 2015-07-30 DIAGNOSIS — O09893 Supervision of other high risk pregnancies, third trimester: Secondary | ICD-10-CM

## 2015-07-30 DIAGNOSIS — Z3A35 35 weeks gestation of pregnancy: Secondary | ICD-10-CM

## 2015-07-30 DIAGNOSIS — O99213 Obesity complicating pregnancy, third trimester: Secondary | ICD-10-CM

## 2015-07-30 DIAGNOSIS — O10913 Unspecified pre-existing hypertension complicating pregnancy, third trimester: Secondary | ICD-10-CM

## 2015-07-30 DIAGNOSIS — Z6841 Body Mass Index (BMI) 40.0 and over, adult: Secondary | ICD-10-CM

## 2015-07-30 DIAGNOSIS — Z331 Pregnant state, incidental: Secondary | ICD-10-CM | POA: Diagnosis not present

## 2015-07-30 DIAGNOSIS — O10919 Unspecified pre-existing hypertension complicating pregnancy, unspecified trimester: Secondary | ICD-10-CM

## 2015-07-30 DIAGNOSIS — O10912 Unspecified pre-existing hypertension complicating pregnancy, second trimester: Secondary | ICD-10-CM | POA: Diagnosis not present

## 2015-07-30 DIAGNOSIS — O280 Abnormal hematological finding on antenatal screening of mother: Secondary | ICD-10-CM

## 2015-07-30 DIAGNOSIS — Z1389 Encounter for screening for other disorder: Secondary | ICD-10-CM

## 2015-07-30 LAB — POCT URINALYSIS DIPSTICK
Blood, UA: NEGATIVE
GLUCOSE UA: NEGATIVE
Ketones, UA: NEGATIVE
LEUKOCYTES UA: NEGATIVE
Nitrite, UA: NEGATIVE
Protein, UA: NEGATIVE

## 2015-07-30 NOTE — Progress Notes (Signed)
US 35wks,breech,post pl gr 2,BPP 8/8,AFI 16.6 cm,RI .59,.60,EFW 2745 g,58%

## 2015-07-30 NOTE — Progress Notes (Signed)
Pt denies any problems or concerns at this time.  

## 2015-07-30 NOTE — Progress Notes (Signed)
Patient ID: Sara BuddsHannah G Hestand, female   DOB: 01/26/1991, 25 y.o.   MRN: [id number]  High Risk Pregnancy Diagnosis(es):   Chronic hypertension  G1P0 1220w0d Estimated Date of Delivery: 09/03/15    HPI: The patient is being seen today for ongoing management of high risk pregnancy due to chronic HTN. Today she reports intermittent mild headaches and dizziness, however, states it may be due to the fact that she has not eaten.  Patient reports good fetal movement, denies any bleeding and no rupture of membranes symptoms or regular contractions.  BP weight and urine results all reviewed and noted. Blood pressure 122/70, pulse 86, weight 371 lb 8 oz (168.511 kg), last menstrual period 10/18/2014.  Fetal Surveillance Testing today:  BPP Fundal Height:  39cm  Fetal Heart rate: 146 per US  Edema:  none Urinalysis: Negative  Questions were answered.  Lab and sonogram results have been reviewed. Comments: normal   Assessment:  1.  Pregnancy at 6720w0d,  Estimated Date of Delivery: 09/03/15                        2. Chronic hypertension   3. Morbid obesity   Medication(s) Plans: No changes labetalol 200 BID, ASA  Treatment Plan: 1. Twice weekly surveillance with sono and NST alternating  By signing my name below, I, Marica OtterNusrat Rahman, attest that this documentation has been prepared under the direction and in the presence of Christin BachJohn Windi Toro, MD. Electronically Signed: Marica OtterNusrat Rahman, ED Scribe. 07/30/2015. 12:34 PM.   I personally performed the services described in this documentation, which was SCRIBED in my presence. The recorded information has been reviewed and considered accurate. It has been edited as necessary during review. Tilda BurrowFERGUSON,Spero Gunnels V, MD

## 2015-07-31 ENCOUNTER — Encounter: Payer: Self-pay | Admitting: Women's Health

## 2015-07-31 ENCOUNTER — Ambulatory Visit (INDEPENDENT_AMBULATORY_CARE_PROVIDER_SITE_OTHER): Payer: Medicaid Other | Admitting: Women's Health

## 2015-07-31 VITALS — BP 130/80 | HR 78 | Wt 369.4 lb

## 2015-07-31 DIAGNOSIS — O10912 Unspecified pre-existing hypertension complicating pregnancy, second trimester: Secondary | ICD-10-CM | POA: Diagnosis not present

## 2015-07-31 DIAGNOSIS — O479 False labor, unspecified: Secondary | ICD-10-CM

## 2015-07-31 DIAGNOSIS — Z1389 Encounter for screening for other disorder: Secondary | ICD-10-CM

## 2015-07-31 DIAGNOSIS — O10919 Unspecified pre-existing hypertension complicating pregnancy, unspecified trimester: Secondary | ICD-10-CM

## 2015-07-31 DIAGNOSIS — O09893 Supervision of other high risk pregnancies, third trimester: Secondary | ICD-10-CM | POA: Diagnosis not present

## 2015-07-31 DIAGNOSIS — Z331 Pregnant state, incidental: Secondary | ICD-10-CM | POA: Diagnosis not present

## 2015-07-31 LAB — POCT URINALYSIS DIPSTICK
Blood, UA: NEGATIVE
Glucose, UA: NEGATIVE
KETONES UA: NEGATIVE
LEUKOCYTES UA: NEGATIVE
Nitrite, UA: NEGATIVE
Protein, UA: NEGATIVE

## 2015-07-31 NOTE — Progress Notes (Signed)
Work-in High Risk Pregnancy Diagnosis(es): CHTN, elevated AFP G1P0 6530w1d Estimated Date of Delivery: 09/03/15 BP 130/80 mmHg  Pulse 78  Wt 369 lb 6.4 oz (167.559 kg)  LMP 10/18/2014 (Approximate)  Urinalysis: Negative HPI:  Feeling cramping and pressure down low, some LBP- all started last night/today. Mom wants her to come out of work. Pt wants to continue working but cut days back to 4/wk from 5/wk- note given.  BP, weight, and urine reviewed.  Reports good fm. Denies regular uc's, lof, vb, uti s/s.   Fundal Height:  46 Fetal Heart rate:  140 Edema:  Trace SVE: LTC, cx very anterior/under SP- hard to reach, breech  Reviewed ptl s/s, fkc. Discussed pain relief measures, gave printed info. Increase po fluids.  All questions were answered Assessment: 3530w1d CHTN, breech Medication(s) Plans:  Continue labetalol, baby asa Treatment Plan:  2x/wk testing nst/sono, IOL @ 39wks. Anticipating ECV next week per JVF Follow up in 2d as scheduled for high-risk OB appt and NST

## 2015-07-31 NOTE — Patient Instructions (Addendum)
For your lower back pain you may:  Purchase a pregnancy belt from [employer], [employer], [employer], etc and wear it while you are up and about  Take warm baths  Use a heating pad to your lower back for no longer than 20 minutes at a time, and do not place near abdomen  Take tylenol as needed. Please follow directions on the bottle   Call the office [phone]) or go to [facility] if:  You begin to have strong, frequent contractions  Your water breaks.  Sometimes it is a big gush of fluid, sometimes it is just a trickle that keeps getting your panties wet or running down your legs  You have vaginal bleeding.  It is normal to have a small amount of spotting if your cervix was checked.   You don't feel your baby moving like normal.  If you don't, get you something to eat and drink and lay down and focus on feeling your baby move.  You should feel at least 10 movements in 2 hours.  If you don't, you should call the office or go to [facility].    Preterm Labor Information Preterm labor is when labor starts at less than 37 weeks of pregnancy. The normal length of a pregnancy is 39 to 41 weeks. CAUSES Often, there is no identifiable underlying cause as to why a woman goes into preterm labor. One of the most common known causes of preterm labor is infection. Infections of the uterus, cervix, vagina, amniotic sac, bladder, kidney, or even the lungs (pneumonia) can cause labor to start. Other suspected causes of preterm labor include:   Urogenital infections, such as yeast infections and bacterial vaginosis.   Uterine abnormalities (uterine shape, uterine septum, fibroids, or bleeding from the placenta).   A cervix that has been operated on (it may fail to stay closed).   Malformations in the fetus.   Multiple gestations (twins, triplets, and so on).   Breakage of the amniotic sac.  RISK FACTORS  Having a previous history of preterm labor.   Having premature  rupture of membranes (PROM).   Having a placenta that covers the opening of the cervix (placenta previa).   Having a placenta that separates from the uterus (placental abruption).   Having a cervix that is too weak to hold the fetus in the uterus (incompetent cervix).   Having too much fluid in the amniotic sac (polyhydramnios).   Taking illegal drugs or smoking while pregnant.   Not gaining enough weight while pregnant.   Being younger than 5318 and older than 26 years old.   Having a low socioeconomic status.   Being African American. SYMPTOMS Signs and symptoms of preterm labor include:   Menstrual-like cramps, abdominal pain, or back pain.  Uterine contractions that are regular, as frequent as six in an hour, regardless of their intensity (may be mild or painful).  Contractions that start on the top of the uterus and spread down to the lower abdomen and back.   A sense of increased pelvic pressure.   A watery or bloody mucus discharge that comes from the vagina.  TREATMENT Depending on the length of the pregnancy and other circumstances, your health care provider may suggest bed rest. If necessary, there are medicines that can be given to stop contractions and to mature the fetal lungs. If labor happens before 34 weeks of pregnancy, a prolonged hospital stay may be recommended. Treatment depends on the condition of both you and  the fetus.  WHAT SHOULD YOU DO IF YOU THINK YOU ARE IN PRETERM LABOR? Call your health care provider right away. You will need to go to the hospital to get checked immediately. HOW CAN YOU PREVENT PRETERM LABOR IN FUTURE PREGNANCIES? You should:   Stop smoking if you smoke.  Maintain healthy weight gain and avoid chemicals and drugs that are not necessary.  Be watchful for any type of infection.  Inform your health care provider if you have a known history of preterm labor.   This information is not intended to replace advice given  to you by your health care provider. Make sure you discuss any questions you have with your health care provider.   Document Released: 08/02/2003 Document Revised: 01/12/2013 Document Reviewed: 06/14/2012 Elsevier Interactive Patient Education 2016 [employer] Inc[employer]  External Cephalic Version External cephalic version is turning a baby that is presenting his or her buttocks first (breech) or is lying sideways in the uterus (transverse) to a head-first position. This makes the labor and delivery faster, safer for the mother and baby, and lessens the chance for a cesarean section. It should not be tried until the pregnancy is [redacted] weeks along or longer. BEFORE THE PROCEDURE   Do not take aspirin.  Do not eat for 4 hours before the procedure.  Tell your caregiver if you have a cold, fever, or an infection.  Tell your caregiver if you are having contractions.  Tell your caregiver if you are leaking or had a gush of fluid from your vagina.  Tell your caregiver if you have any vaginal bleeding or abnormal discharge.  If you are being admitted the same day, arrive at the hospital at least one hour before the procedure to sign any necessary documents and to get prepared for the procedure.  Tell your caregiver if you had any problems with anesthetics in the past.  Tell your caregiver if you are taking any medications that your caregiver does not know about. This includes over-the-counter and prescription drugs, herbs, eye drops and creams. PROCEDURE  First, an ultrasound is done to make sure the baby is breech or transverse.  A non-stress test or biophysical profile is done on the baby before the ECV. This is done to make sure it is safe for the baby to have the ECV. It may also be done after the procedure to make sure the baby is okay.  ECV is done in the delivery/surgical room with an anesthesiologist present. There should be a setup for an emergency cesarean section with a full nursing and  nursery staff available and ready.  The patient may be given a medication to relax the uterine muscles. An epidural may be given for any discomfort. It is helpful for the success of the ECV.  An electronic fetal monitor is placed on the uterus during the procedure to make sure the baby is okay.  If the mother is Rh-negative, Rho (D) immune globulin will be given to her to prevent Rh problems for future pregnancies.  The mother is followed closely for 2 to 3 hours after the procedure to make sure no problems develop. BENEFITS OF ECV  Easier and safer labor and delivery for the mother and baby.  Lower incidence of cesarean section.  Lower costs with a vaginal delivery. RISKS OF ECV  The placenta pulls away from the wall of the uterus before delivery (abruption of the placenta).  Rupture of the uterus, especially in patients with a previous cesarean section.  Fetal distress.  Early (premature) labor.  Premature rupture of the membranes.  The baby will return to the breech or transverse lie position.  Death of the fetus can happen but is very rare. ECV SHOULD BE STOPPED IF: 5. The fetal heart tones drop. 6. The mother is having a lot of pain. 7. You cannot turn the baby after several attempts. ECV SHOULD NOT BE DONE IF:  The non-stress test or biophysical profile is abnormal.  There is vaginal bleeding.  An abnormal shaped uterus is present.  There is heart disease or uncontrolled high blood pressure in the mother.  There are twins or more.  The placenta covers the opening of the cervix (placenta previa).  You had a previous cesarean section with a classical incision or major surgery of the uterus.  There is not enough amniotic fluid in the sac (oligohydramnios).  The baby is too small for the pregnancy or has not developed normally (anomaly).  Your membranes have ruptured. HOME CARE INSTRUCTIONS   Have someone take you home after the procedure.  Rest at home  for several hours.  Have someone stay with you for a few hours after you get home.  After ECV, continue with your prenatal visits as directed.  Continue your regular diet, rest and activities.  Do not do any strenuous activities for a couple of days. SEEK IMMEDIATE MEDICAL CARE IF:   You develop vaginal bleeding.  You have fluid coming out of your vagina (bag of water may have broken).  You develop uterine contractions.  You do not feel the baby move or there is less movement of the baby.  You develop abdominal pain.  You develop an oral temperature of 102 F (38.9 C) or higher.   This information is not intended to replace advice given to you by your health care provider. Make sure you discuss any questions you have with your health care provider.   Document Released: 11/04/2006 Document Revised: 06/02/2014 Document Reviewed: 08/30/2008 Elsevier Interactive Patient Education [employer]

## 2015-08-02 ENCOUNTER — Ambulatory Visit (INDEPENDENT_AMBULATORY_CARE_PROVIDER_SITE_OTHER): Payer: Medicaid Other | Admitting: Advanced Practice Midwife

## 2015-08-02 ENCOUNTER — Encounter: Payer: Self-pay | Admitting: Advanced Practice Midwife

## 2015-08-02 VITALS — BP 126/82 | HR 86 | Wt 369.0 lb

## 2015-08-02 DIAGNOSIS — Z1389 Encounter for screening for other disorder: Secondary | ICD-10-CM | POA: Diagnosis not present

## 2015-08-02 DIAGNOSIS — O09893 Supervision of other high risk pregnancies, third trimester: Secondary | ICD-10-CM | POA: Diagnosis not present

## 2015-08-02 DIAGNOSIS — O10919 Unspecified pre-existing hypertension complicating pregnancy, unspecified trimester: Secondary | ICD-10-CM

## 2015-08-02 DIAGNOSIS — O10913 Unspecified pre-existing hypertension complicating pregnancy, third trimester: Secondary | ICD-10-CM

## 2015-08-02 DIAGNOSIS — Z3A36 36 weeks gestation of pregnancy: Secondary | ICD-10-CM

## 2015-08-02 DIAGNOSIS — O280 Abnormal hematological finding on antenatal screening of mother: Secondary | ICD-10-CM

## 2015-08-02 DIAGNOSIS — O321XX1 Maternal care for breech presentation, fetus 1: Secondary | ICD-10-CM

## 2015-08-02 DIAGNOSIS — Z331 Pregnant state, incidental: Secondary | ICD-10-CM | POA: Diagnosis not present

## 2015-08-02 LAB — POCT URINALYSIS DIPSTICK
Blood, UA: NEGATIVE
GLUCOSE UA: NEGATIVE
KETONES UA: NEGATIVE
Leukocytes, UA: NEGATIVE
Nitrite, UA: NEGATIVE
Protein, UA: NEGATIVE

## 2015-08-02 NOTE — Progress Notes (Signed)
Fetal Surveillance Testing today:  NST   High Risk Pregnancy Diagnosis(es):   CHTN, abn AFP  G1P0 2031w3d Estimated Date of Delivery: 09/03/15  Blood pressure 126/82, pulse 86, weight 369 lb (167.377 kg), last menstrual period 10/18/2014.  Urinalysis: Negative   HPI: The patient is being seen today for ongoing management of CHTN. Today she reports no complaints   BP weight and urine results all reviewed and noted. Patient reports good fetal movement, denies any bleeding and no rupture of membranes symptoms or regular contractions.  Fundal Height:  unmeasurable Fetal Heart rate:  150's Edema:  no  Patient is without complaints other than noted in her HPI. All questions were answered.  All lab and sonogram results have been reviewed. Comments:  NST reactive  Assessment:  1.  Pregnancy at 5831w3d,  Estimated Date of Delivery: 09/03/15 :                          2.  CHTN, stable                        3.  BREECH  Medication(s) Plans:  Continue Labetalol 200mg  BID  Treatment Plan:  Continue twice weekly testing (NST/US); schedule ECV for 37 weeks; IOL 39 weeks  Return in about 4 days (around 08/06/2015) for HROB, US:BPP. for appointment for high risk OB care  No orders of the defined types were placed in this encounter.   Orders Placed This Encounter  Procedures  . US OB Follow Up  . POCT urinalysis dipstick  . Fetal nonstress test

## 2015-08-02 NOTE — Progress Notes (Signed)
Pt denies any problems or concerns at this time.  

## 2015-08-03 ENCOUNTER — Other Ambulatory Visit: Payer: Self-pay | Admitting: Advanced Practice Midwife

## 2015-08-03 DIAGNOSIS — O10919 Unspecified pre-existing hypertension complicating pregnancy, unspecified trimester: Secondary | ICD-10-CM

## 2015-08-03 DIAGNOSIS — O09893 Supervision of other high risk pregnancies, third trimester: Secondary | ICD-10-CM

## 2015-08-03 DIAGNOSIS — O280 Abnormal hematological finding on antenatal screening of mother: Secondary | ICD-10-CM

## 2015-08-06 ENCOUNTER — Ambulatory Visit (INDEPENDENT_AMBULATORY_CARE_PROVIDER_SITE_OTHER): Payer: Medicaid Other

## 2015-08-06 ENCOUNTER — Ambulatory Visit (INDEPENDENT_AMBULATORY_CARE_PROVIDER_SITE_OTHER): Payer: Medicaid Other | Admitting: Women's Health

## 2015-08-06 ENCOUNTER — Encounter: Payer: Self-pay | Admitting: Women's Health

## 2015-08-06 VITALS — BP 142/78 | HR 72 | Wt 374.0 lb

## 2015-08-06 DIAGNOSIS — O289 Unspecified abnormal findings on antenatal screening of mother: Secondary | ICD-10-CM

## 2015-08-06 DIAGNOSIS — O10912 Unspecified pre-existing hypertension complicating pregnancy, second trimester: Secondary | ICD-10-CM | POA: Diagnosis not present

## 2015-08-06 DIAGNOSIS — O09893 Supervision of other high risk pregnancies, third trimester: Secondary | ICD-10-CM

## 2015-08-06 DIAGNOSIS — Z1389 Encounter for screening for other disorder: Secondary | ICD-10-CM | POA: Diagnosis not present

## 2015-08-06 DIAGNOSIS — Z118 Encounter for screening for other infectious and parasitic diseases: Secondary | ICD-10-CM

## 2015-08-06 DIAGNOSIS — Z6841 Body Mass Index (BMI) 40.0 and over, adult: Principal | ICD-10-CM

## 2015-08-06 DIAGNOSIS — Z1159 Encounter for screening for other viral diseases: Secondary | ICD-10-CM

## 2015-08-06 DIAGNOSIS — O280 Abnormal hematological finding on antenatal screening of mother: Secondary | ICD-10-CM

## 2015-08-06 DIAGNOSIS — O10919 Unspecified pre-existing hypertension complicating pregnancy, unspecified trimester: Secondary | ICD-10-CM

## 2015-08-06 DIAGNOSIS — Z331 Pregnant state, incidental: Secondary | ICD-10-CM | POA: Diagnosis not present

## 2015-08-06 DIAGNOSIS — Z3685 Encounter for antenatal screening for Streptococcus B: Secondary | ICD-10-CM

## 2015-08-06 LAB — POCT URINALYSIS DIPSTICK
Blood, UA: NEGATIVE
GLUCOSE UA: NEGATIVE
Ketones, UA: NEGATIVE
LEUKOCYTES UA: NEGATIVE
NITRITE UA: NEGATIVE
Protein, UA: NEGATIVE

## 2015-08-06 NOTE — Progress Notes (Signed)
High Risk Pregnancy Diagnosis(es): CHTN, elevated AFP G1P0 6222w0d Estimated Date of Delivery: 09/03/15 BP 142/78 mmHg  Pulse 72  Wt 374 lb (169.645 kg)  LMP 10/18/2014 (Approximate) Initial bp 162/66 Urinalysis: Negative HPI:  Denies ha, scotomata, ruq/epigastric pain, n/v.  Just took am dose of labetalol right before coming to appt.  BP, weight, and urine reviewed.  Reports good fm. Denies regular uc's, lof, vb, uti s/s. No complaints.  Fundal Height:  48 Fetal Heart rate:  151 u/s GBS collected Edema:  1+, DTRs 1-2+, no clonus  Reviewed today's u/s: baby still breech- wants ECV, discussed risks/benefits- wants w/ JVF- he is on call this Sun 3/19, pt will be 36.6wks. Scheduled for 3/19 @ 0800, be there @ 0700, npo after midnight. Remainder of u/s normal: bpp 8/8, normal dopplers, AFI 20cm. Discussed ptl s/s, fkc, pre-e s/s, [facility]  Discussed bp, 5lb wt gain w/ JVF, will get pre-e labs today, ok to return in 3d as planned All questions were answered Assessment: 3422w0d CHTN, elevated AFP, breech Medication(s) Plans:  Continue baby asa and labetalol 200mg  BID Treatment Plan: pre-e labs today, 2x/wk testing sono alt w/ nst, ECV scheduled for Sun 3/19 @0800  w/ JVF, IOL @ 39wks or earlier if indicated Follow up in 3d for high-risk OB appt w/ JVF and NST

## 2015-08-06 NOTE — Progress Notes (Signed)
US 36 wks,breech,post pl gr 2,bilat adnexa's wnl,BPP 8/8,RI .60,.69,FHR 151 bpm,afi 20cm

## 2015-08-06 NOTE — Patient Instructions (Signed)
Your version is scheduled for Sunday 3/19 @ 8:00am, be there at 7:00am. DO NOT eat or drink after midnight the night before.   Call the office [phone]) or go to [facility] if:  You begin to have strong, frequent contractions  Your water breaks.  Sometimes it is a big gush of fluid, sometimes it is just a trickle that keeps getting your panties wet or running down your legs  You have vaginal bleeding.  It is normal to have a small amount of spotting if your cervix was checked.   You don't feel your baby moving like normal.  If you don't, get you something to eat and drink and lay down and focus on feeling your baby move.  You should feel at least 10 movements in 2 hours.  If you don't, you should call the office or go to [facility].    Call the office [phone]) or go to [facility] hospital for these signs of pre-eclampsia:  Severe headache that does not go away with Tylenol  Visual changes- seeing spots, double, blurred vision  Pain under your right breast or upper abdomen that does not go away with Tums or heartburn medicine  Nausea and/or vomiting  Severe swelling in your hands, feet, and face    External Cephalic Version External cephalic version is turning a baby that is presenting his or her buttocks first (breech) or is lying sideways in the uterus (transverse) to a head-first position. This makes the labor and delivery faster, safer for the mother and baby, and lessens the chance for a cesarean section. It should not be tried until the pregnancy is [redacted] weeks along or longer. BEFORE THE PROCEDURE   Do not take aspirin.  Do not eat for 4 hours before the procedure.  Tell your caregiver if you have a cold, fever, or an infection.  Tell your caregiver if you are having contractions.  Tell your caregiver if you are leaking or had a gush of fluid from your vagina.  Tell your caregiver if you have any vaginal bleeding or abnormal discharge.  If you are being admitted  the same day, arrive at the hospital at least one hour before the procedure to sign any necessary documents and to get prepared for the procedure.  Tell your caregiver if you had any problems with anesthetics in the past.  Tell your caregiver if you are taking any medications that your caregiver does not know about. This includes over-the-counter and prescription drugs, herbs, eye drops and creams. PROCEDURE  First, an ultrasound is done to make sure the baby is breech or transverse.  A non-stress test or biophysical profile is done on the baby before the ECV. This is done to make sure it is safe for the baby to have the ECV. It may also be done after the procedure to make sure the baby is okay.  ECV is done in the delivery/surgical room with an anesthesiologist present. There should be a setup for an emergency cesarean section with a full nursing and nursery staff available and ready.  The patient may be given a medication to relax the uterine muscles. An epidural may be given for any discomfort. It is helpful for the success of the ECV.  An electronic fetal monitor is placed on the uterus during the procedure to make sure the baby is okay.  If the mother is Rh-negative, Rho (D) immune globulin will be given to her to prevent Rh problems for future pregnancies.  The  mother is followed closely for 2 to 3 hours after the procedure to make sure no problems develop. BENEFITS OF ECV  Easier and safer labor and delivery for the mother and baby.  Lower incidence of cesarean section.  Lower costs with a vaginal delivery. RISKS OF ECV  The placenta pulls away from the wall of the uterus before delivery (abruption of the placenta).  Rupture of the uterus, especially in patients with a previous cesarean section.  Fetal distress.  Early (premature) labor.  Premature rupture of the membranes.  The baby will return to the breech or transverse lie position.  Death of the fetus can happen  but is very rare. ECV SHOULD BE STOPPED IF:  The fetal heart tones drop.  The mother is having a lot of pain.  You cannot turn the baby after several attempts. ECV SHOULD NOT BE DONE IF:  The non-stress test or biophysical profile is abnormal.  There is vaginal bleeding.  An abnormal shaped uterus is present.  There is heart disease or uncontrolled high blood pressure in the mother.  There are twins or more.  The placenta covers the opening of the cervix (placenta previa).  You had a previous cesarean section with a classical incision or major surgery of the uterus.  There is not enough amniotic fluid in the sac (oligohydramnios).  The baby is too small for the pregnancy or has not developed normally (anomaly).  Your membranes have ruptured. HOME CARE INSTRUCTIONS   Have someone take you home after the procedure.  Rest at home for several hours.  Have someone stay with you for a few hours after you get home.  After ECV, continue with your prenatal visits as directed.  Continue your regular diet, rest and activities.  Do not do any strenuous activities for a couple of days. SEEK IMMEDIATE MEDICAL CARE IF:   You develop vaginal bleeding.  You have fluid coming out of your vagina (bag of water may have broken).  You develop uterine contractions.  You do not feel the baby move or there is less movement of the baby.  You develop abdominal pain.  You develop an oral temperature of 102 F (38.9 C) or higher.   This information is not intended to replace advice given to you by your health care provider. Make sure you discuss any questions you have with your health care provider.   Document Released: 11/04/2006 Document Revised: 06/02/2014 Document Reviewed: 08/30/2008 Elsevier Interactive Patient Education [employer]

## 2015-08-07 ENCOUNTER — Telehealth (HOSPITAL_COMMUNITY): Payer: Self-pay | Admitting: *Deleted

## 2015-08-07 ENCOUNTER — Encounter (HOSPITAL_COMMUNITY): Payer: Self-pay | Admitting: *Deleted

## 2015-08-07 LAB — CBC
Hematocrit: 35.5 % (ref 34.0–46.6)
Hemoglobin: 11.8 g/dL (ref 11.1–15.9)
MCH: 28.6 pg (ref 26.6–33.0)
MCHC: 33.2 g/dL (ref 31.5–35.7)
MCV: 86 fL (ref 79–97)
PLATELETS: 285 10*3/uL (ref 150–379)
RBC: 4.13 x10E6/uL (ref 3.77–5.28)
RDW: 13.2 % (ref 12.3–15.4)
WBC: 9.6 10*3/uL (ref 3.4–10.8)

## 2015-08-07 LAB — COMPREHENSIVE METABOLIC PANEL
A/G RATIO: 1.5 (ref 1.2–2.2)
ALT: 67 IU/L — AB (ref 0–32)
AST: 25 IU/L (ref 0–40)
Albumin: 3.4 g/dL — ABNORMAL LOW (ref 3.5–5.5)
Alkaline Phosphatase: 152 IU/L — ABNORMAL HIGH (ref 39–117)
BUN/Creatinine Ratio: 13 (ref 8–20)
BUN: 7 mg/dL (ref 6–20)
Bilirubin Total: 0.5 mg/dL (ref 0.0–1.2)
CALCIUM: 9.1 mg/dL (ref 8.7–10.2)
CO2: 23 mmol/L (ref 18–29)
Chloride: 102 mmol/L (ref 96–106)
Creatinine, Ser: 0.56 mg/dL — ABNORMAL LOW (ref 0.57–1.00)
GFR, EST AFRICAN AMERICAN: 150 mL/min/{1.73_m2} (ref >59)
GFR, EST NON AFRICAN AMERICAN: 130 mL/min/{1.73_m2} (ref >59)
GLUCOSE: 100 mg/dL — AB (ref 65–99)
Globulin, Total: 2.2 g/dL (ref 1.5–4.5)
Potassium: 4.7 mmol/L (ref 3.5–5.2)
Sodium: 139 mmol/L (ref 134–144)
TOTAL PROTEIN: 5.6 g/dL — AB (ref 6.0–8.5)

## 2015-08-07 LAB — PROTEIN / CREATININE RATIO, URINE
Creatinine, Urine: 54.2 mg/dL
PROTEIN/CREAT RATIO: 118 mg/g{creat} (ref 0–200)
Protein, Ur: 6.4 mg/dL

## 2015-08-07 LAB — GC/CHLAMYDIA PROBE AMP
CHLAMYDIA, DNA PROBE: NEGATIVE
Neisseria gonorrhoeae by PCR: NEGATIVE

## 2015-08-07 LAB — OB RESULTS CONSOLE GBS: STREP GROUP B AG: POSITIVE

## 2015-08-07 NOTE — Telephone Encounter (Signed)
Preadmission screen  

## 2015-08-08 ENCOUNTER — Encounter (HOSPITAL_COMMUNITY): Payer: Self-pay | Admitting: *Deleted

## 2015-08-08 ENCOUNTER — Inpatient Hospital Stay (HOSPITAL_COMMUNITY)
Admission: AD | Admit: 2015-08-08 | Discharge: 2015-08-08 | Disposition: A | Discharge status: Home / Self Care | Payer: Medicaid Other | Source: Ambulatory Visit | Attending: Obstetrics and Gynecology | Admitting: Obstetrics and Gynecology

## 2015-08-08 ENCOUNTER — Encounter: Payer: Self-pay | Admitting: *Deleted

## 2015-08-08 DIAGNOSIS — Z3A36 36 weeks gestation of pregnancy: Secondary | ICD-10-CM | POA: Insufficient documentation

## 2015-08-08 DIAGNOSIS — O10013 Pre-existing essential hypertension complicating pregnancy, third trimester: Secondary | ICD-10-CM | POA: Insufficient documentation

## 2015-08-08 DIAGNOSIS — O10913 Unspecified pre-existing hypertension complicating pregnancy, third trimester: Secondary | ICD-10-CM

## 2015-08-08 DIAGNOSIS — Z7982 Long term (current) use of aspirin: Secondary | ICD-10-CM | POA: Diagnosis not present

## 2015-08-08 DIAGNOSIS — R030 Elevated blood-pressure reading, without diagnosis of hypertension: Secondary | ICD-10-CM | POA: Diagnosis present

## 2015-08-08 DIAGNOSIS — O1203 Gestational edema, third trimester: Secondary | ICD-10-CM | POA: Diagnosis not present

## 2015-08-08 DIAGNOSIS — Z87891 Personal history of nicotine dependence: Secondary | ICD-10-CM | POA: Diagnosis not present

## 2015-08-08 HISTORY — DX: Anxiety disorder, unspecified: F41.9

## 2015-08-08 HISTORY — DX: Depression, unspecified: F32.A

## 2015-08-08 HISTORY — DX: Major depressive disorder, single episode, unspecified: F32.9

## 2015-08-08 LAB — COMPREHENSIVE METABOLIC PANEL
ALK PHOS: 121 U/L (ref 38–126)
ALT: 48 U/L (ref 14–54)
ANION GAP: 5 (ref 5–15)
AST: 21 U/L (ref 15–41)
Albumin: 2.9 g/dL — ABNORMAL LOW (ref 3.5–5.0)
BILIRUBIN TOTAL: 0.8 mg/dL (ref 0.3–1.2)
BUN: 10 mg/dL (ref 6–20)
CALCIUM: 9 mg/dL (ref 8.9–10.3)
CO2: 25 mmol/L (ref 22–32)
CREATININE: 0.66 mg/dL (ref 0.44–1.00)
Chloride: 109 mmol/L (ref 101–111)
GFR calc non Af Amer: >60 mL/min (ref >60)
GLUCOSE: 84 mg/dL (ref 65–99)
Potassium: 4.2 mmol/L (ref 3.5–5.1)
SODIUM: 139 mmol/L (ref 135–145)
TOTAL PROTEIN: 6.3 g/dL — AB (ref 6.5–8.1)

## 2015-08-08 LAB — URINALYSIS, ROUTINE W REFLEX MICROSCOPIC
Bilirubin Urine: NEGATIVE
GLUCOSE, UA: NEGATIVE mg/dL
HGB URINE DIPSTICK: NEGATIVE
Ketones, ur: NEGATIVE mg/dL
Leukocytes, UA: NEGATIVE
Nitrite: NEGATIVE
Protein, ur: NEGATIVE mg/dL
SPECIFIC GRAVITY, URINE: 1.025 (ref 1.005–1.030)
pH: 6 (ref 5.0–8.0)

## 2015-08-08 LAB — STREP GP B NAA: Strep Gp B NAA: POSITIVE — AB

## 2015-08-08 LAB — PROTEIN / CREATININE RATIO, URINE
Creatinine, Urine: 157 mg/dL
Protein Creatinine Ratio: 0.08 mg/mg{Cre} (ref 0.00–0.15)
TOTAL PROTEIN, URINE: 12 mg/dL

## 2015-08-08 LAB — CBC
HEMATOCRIT: 34.4 % — AB (ref 36.0–46.0)
HEMOGLOBIN: 11.6 g/dL — AB (ref 12.0–15.0)
MCH: 28.9 pg (ref 26.0–34.0)
MCHC: 33.7 g/dL (ref 30.0–36.0)
MCV: 85.6 fL (ref 78.0–100.0)
Platelets: 302 10*3/uL (ref 150–400)
RBC: 4.02 MIL/uL (ref 3.87–5.11)
RDW: 13.2 % (ref 11.5–15.5)
WBC: 11 10*3/uL — ABNORMAL HIGH (ref 4.0–10.5)

## 2015-08-08 NOTE — MAU Provider Note (Signed)
MAU HISTORY AND PHYSICAL  Chief Complaint:  Swelling, blood pressure  Sara Vargas is a 25 y.o.  G1P0 with IUP at [redacted]w[redacted]d presenting for leg swelling, elevated blood pressure.  Last few days more swelling than normal in hands. Today at work checked BP and systolic was 150s. Had some left upper quadrant pain at the time that has since resolved spontaneously. No ha, no vision change, no current abdominal pain, no sob. Compliant with bid labetalol for chtn. No contractions, lof, or vaginal bleeding. Positive fetal movement.    Past Medical History  Diagnosis Date  . Hypertension     labetolol 200 bid  . Anxiety   . Depression     Past Surgical History  Procedure Laterality Date  . Tonsillectomy    . Arm surgery    . Ureteral exploration      Family History  Problem Relation Age of Onset  . Heart disease Mother   . COPD Mother   . Asthma Mother   . Other Father     benign brain tumor  . Heart disease Maternal Grandmother     Social History  Substance Use Topics  . Smoking status: Former Smoker -- 6.00 packs/day    Types: Cigarettes    Quit date: 03/09/2015  . Smokeless tobacco: Never Used  . Alcohol Use: No    No Known Allergies  Prescriptions prior to admission  Medication Sig Dispense Refill Last Dose  . acetaminophen (TYLENOL) 500 MG tablet Take 1,000 mg by mouth every 6 (six) hours as needed for moderate pain.   Past Month at Unknown time  . aspirin 81 MG tablet Take 81 mg by mouth daily. Reported on 06/13/2015   Past Month at Unknown time  . labetalol (NORMODYNE) 200 MG tablet Take 1 tablet (200 mg total) by mouth 2 (two) times daily. 60 tablet 3 08/08/2015 at 1400  . Prenatal Vit-Fe Fumarate-FA (MULTIVITAMIN-PRENATAL) 27-0.8 MG TABS tablet Take 1 tablet by mouth daily at 12 noon. 30 each 11 08/08/2015 at Unknown time    Review of Systems - Negative except for what is mentioned in HPI.  Physical Exam  Blood pressure 117/67, pulse 83, temperature 98.8 F (37.1  C), temperature source Oral, resp. rate 18, height 5\' 5"  (1.651 m), weight 377 lb 12.8 oz (171.369 kg), last menstrual period 10/18/2014, SpO2 100 %. GENERAL: Well-developed, well-nourished female in no acute distress.  LUNGS: Clear to auscultation bilaterally.  HEART: Regular rate and rhythm. ABDOMEN: Soft, nontender, nondistended, gravid.  EXTREMITIES: Nontender, no edema, 2+ distal pulses. Neuro: DTRs 1+ bilaterally FHT:  145/mod/+a/-d Contractions: none   Labs: No results found for this or any previous visit (from the past 24 hour(s)).  Imaging Studies:  US Ob Follow Up  07/31/2015  FOLLOW UP SONOGRAM Sara Vargas is in the office for a follow up sonogram for BPP,EFW and cord doppler. She is a 25 y.o. year old G1P0 with Estimated Date of Delivery: 09/03/15 by early ultrasound now at  [redacted]w[redacted]d weeks gestation. Thus far the pregnancy has been complicated by abnormal genetic testing,obesity,chtn.. GESTATION: SINGLETON PRESENTATION: breech frank FETAL ACTIVITY:          Heart rate         146          The fetus is active. AMNIOTIC FLUID: The amniotic fluid volume is  normal, 16.5 cm. PLACENTA LOCALIZATION:  posterior GRADE 2 CERVIX: Limited view ADNEXA: wnl GESTATIONAL AGE AND  BIOMETRICS: Gestational criteria: Estimated Date of  Delivery: 09/03/15 by early ultrasound now at [redacted]w[redacted]d Previous Scans:7          BIPARIETAL DIAMETER           8.74 cm         35+2 weeks HEAD CIRCUMFERENCE           31.70 cm         35+4 weeks ABDOMINAL CIRCUMFERENCE           31.81 cm         35+5 weeks FEMUR LENGTH           6.83 cm         35+1 weeks                                                       AVERAGE EGA(BY THIS SCAN):  35+3 weeks                                                 ESTIMATED FETAL WEIGHT:       2745  grams, 58 % ANATOMICAL SURVEY                                                                            COMMENTS CEREBRAL VENTRICLES    CHOROID PLEXUS    CEREBELLUM    CISTERNA MAGNA    NUCHAL REGION     ORBITS    NASAL BONE    NOSE/LIP yes normal  FACIAL PROFILE yes normal  4 CHAMBERED HEART    OUTFLOW TRACTS    DIAPHRAGM yes normal  STOMACH yes normal  RENAL REGION yes normal  BLADDER yes normal  CORD INSERTION    3 VESSEL CORD    SPINE    ARMS/HANDS    LEGS/FEET    GENITALIA yes normal female     SUSPECTED ABNORMALITIES:  no QUALITY OF SCAN: Limited ultrasound because of pt body habitus TECHNICIAN COMMENTS: [place] 35wks,breech,post pl gr 2,BPP 8/8,AFI 16.6 cm,RI .59,.60,EFW 2745 g,58% A copy of this report including all images has been saved and backed up to a second source for retrieval if needed. All measures and details of the anatomical scan, placentation, fluid volume and pelvic anatomy are contained in that report. Sara Vargas 07/30/2015 12:27 PM Clinical Impression and recommendations: I have reviewed the sonogram results above. Combined with the patient's current clinical course, below are my impressions and any appropriate recommendations for management based on the sonographic findings: 1.fetal wellbeing appears present, with specifically reassuring fetal assessment with good BPP, fluid and excellent Doppler flow 2. BREECH presentation, nearing 36wks, when consideration of attempted External version(ECV) to be discussed with patient. Initial discussion of ECV has happened, will discuss further at next week appt. Pt obesity is an factor in reduced success rates re: ECV. 3 Normal growth at 58%ile for gest age. Sara Vargas   US Ob Follow Up  07/16/2015  FOLLOW UP SONOGRAM Sara Vargas is in the office for a follow up sonogram for EFW,BPP and cord doppler. She is a 25 y.o. year old G1P0 with Estimated Date of Delivery: 09/03/15 by early ultrasound now at  101w0d weeks gestation. Thus far the pregnancy has been complicated by obesity,chtn,abnormal genetic testing.Marland Kitchen GESTATION: SINGLETON PRESENTATION: Transverse head rt FETAL ACTIVITY:          Heart rate         138 bpm          The fetus is active.  AMNIOTIC FLUID: The amniotic fluid volume is  normal, 18 cm. PLACENTA LOCALIZATION:  posterior GRADE 1 CERVIX: Limited view ADNEXA: wnl GESTATIONAL AGE AND  BIOMETRICS: Gestational criteria: Estimated Date of Delivery: 09/03/15 by early ultrasound now at [redacted]w[redacted]d Previous Scans:5          BIPARIETAL DIAMETER           8.29 cm         33+4 weeks HEAD CIRCUMFERENCE           30.45 cm         33+6 weeks ABDOMINAL CIRCUMFERENCE           29.07 cm         33+1 weeks FEMUR LENGTH           6.26 cm         32+3 weeks                                                       AVERAGE EGA(BY THIS SCAN):  33+2 weeks                                                 ESTIMATED FETAL WEIGHT:       2111  grams, 47 % BIOPHYSCIAL PROFILE:                                                                                                      COMMENTS GROSS BODY MOVEMENT                 2  TONE                2  RESPIRATIONS                2  AMNIOTIC FLUID                2  SCORE:  8/8 (Note: NST was not performed as part of this antepartum testing) DOPPLER FLOW STUDIES: UMBILICAL ARTERY RI RATIOS:   0.55, 0.45 ANATOMICAL SURVEY                                                                            COMMENTS CEREBRAL VENTRICLES yes normal  CHOROID PLEXUS yes normal  CEREBELLUM    CISTERNA MAGNA    NUCHAL REGION    ORBITS    NASAL BONE    NOSE/LIP yes normal  FACIAL PROFILE yes normal  4 CHAMBERED HEART    OUTFLOW TRACTS    DIAPHRAGM yes normal  STOMACH yes normal  RENAL REGION yes normal  BLADDER yes normal  CORD INSERTION    3 VESSEL CORD    SPINE yes normal  ARMS/HANDS    LEGS/FEET    GENITALIA yes normal female     SUSPECTED ABNORMALITIES:  no QUALITY OF SCAN: Limited view because of pt body habitus TECHNICIAN COMMENTS: [place] 33wks,trans head rt,post pl gr 1,bilat adnexa's wnl,BPP 8/8,efw 2111g 47%,afi 18%,RI .55,.45,FHR 138 BPM A copy of this report including all images has been saved  and backed up to a second source for retrieval if needed. All measures and details of the anatomical scan, placentation, fluid volume and pelvic anatomy are contained in that report. Sara Vargas 07/16/2015 12:23 PM Clinical Impression and recommendations: I have reviewed the sonogram results above, combined with the patient's current clinical course, below are my impressions and any appropriate recommendations for management based on the sonographic findings. 1.  G1P0 Estimated Date of Delivery: 09/03/15 by serial sonographic evaluations 2.  Fetal sonographic surveillance findings: a). Normal fluid volume b). Normal antepartum fetal assessment with BPP 8/8 c). Normal fetal Doppler ratios with consistent diastolic flow d). Normal growth percentile with appropriate interval growth 3.  Normal general sonographic findings Recommend continued prenatal evaluations and care based on this sonogram and as clinically indicated from the patient's clinical course. Lazaro Arms 07/16/2015 12:28 PM   US Fetal Bpp W/o Non Stress  08/07/2015  FOLLOW UP SONOGRAM Sara Vargas is in the office for a follow up sonogram for BPP and cord doppler. She is a 25 y.o. year old G1P0 with Estimated Date of Delivery: 09/03/15 by early ultrasound now at  [redacted]w[redacted]d weeks gestation. Thus far the pregnancy has been complicated by obesity,chtn,abnormal genetic test.. GESTATION: SINGLETON PRESENTATION: breech complete FETAL ACTIVITY:          Heart rate         151          The fetus is active. AMNIOTIC FLUID: The amniotic fluid volume is  normal, 20 cm. PLACENTA LOCALIZATION:  posterior GRADE 2 CERVIX: Limited view ADNEXA: wnl GESTATIONAL AGE AND  BIOMETRICS: Gestational criteria: Estimated Date of Delivery: 09/03/15 by early ultrasound now at [redacted]w[redacted]d Previous Scans:8 BIOPHYSCIAL PROFILE:  COMMENTS GROSS BODY MOVEMENT                 2  TONE                 2  RESPIRATIONS                2  AMNIOTIC FLUID                2                                                          SCORE:  8/8 (Note: NST was not performed as part of this antepartum testing) DOPPLER FLOW STUDIES: UMBILICAL ARTERY RI RATIOS:   0.60, 0.69 ANATOMICAL SURVEY                                                                            COMMENTS CEREBRAL VENTRICLES yes normal  CHOROID PLEXUS yes normal  CEREBELLUM    CISTERNA MAGNA    NUCHAL REGION    ORBITS    NASAL BONE    NOSE/LIP    FACIAL PROFILE    4 CHAMBERED HEART    OUTFLOW TRACTS    DIAPHRAGM yes normal  STOMACH yes normal  RENAL REGION yes normal  BLADDER yes normal  CORD INSERTION    3 VESSEL CORD    SPINE    ARMS/HANDS    LEGS/FEET    GENITALIA   female     SUSPECTED ABNORMALITIES:  yes breech QUALITY OF SCAN: Limited view because of pt body habitus TECHNICIAN COMMENTS: [place] 36 wks,breech,post pl gr 2,bilat adnexa's wnl,BPP 8/8,RI .60,.69,FHR 151 bpm,afi 20cm A copy of this report including all images has been saved and backed up to a second source for retrieval if needed. All measures and details of the anatomical scan, placentation, fluid volume and pelvic anatomy are contained in that report. Sara Vargas 08/06/2015 1:54 PM Clinical Impression and recommendations: I have reviewed the sonogram results above. Combined with the patient's current clinical course, below are my impressions and any appropriate recommendations for management based on the sonographic findings: 1. Breech presentation, in a massively obese patient, to be considered for external version. 2. Generally healthy fetal status, specifically reassuring fetal assessment with good BPP, fluid and excellent Doppler flow 3 will attempt external version at 37 wk. Sara Vargas   US Fetal Bpp W/o Non Stress  07/31/2015  FOLLOW UP SONOGRAM Sara Vargas is in the office for a follow up sonogram for BPP,EFW and cord doppler. She is a 25 y.o. year old G1P0 with Estimated  Date of Delivery: 09/03/15 by early ultrasound now at  [redacted]w[redacted]d weeks gestation. Thus far the pregnancy has been complicated by abnormal genetic testing,obesity,chtn.. GESTATION: SINGLETON PRESENTATION: breech frank FETAL ACTIVITY:          Heart rate         146          The fetus is active. AMNIOTIC FLUID: The amniotic fluid  volume is  normal, 16.5 cm. PLACENTA LOCALIZATION:  posterior GRADE 2 CERVIX: Limited view ADNEXA: wnl GESTATIONAL AGE AND  BIOMETRICS: Gestational criteria: Estimated Date of Delivery: 09/03/15 by early ultrasound now at [redacted]w[redacted]d Previous Scans:7          BIPARIETAL DIAMETER           8.74 cm         35+2 weeks HEAD CIRCUMFERENCE           31.70 cm         35+4 weeks ABDOMINAL CIRCUMFERENCE           31.81 cm         35+5 weeks FEMUR LENGTH           6.83 cm         35+1 weeks                                                       AVERAGE EGA(BY THIS SCAN):  35+3 weeks                                                 ESTIMATED FETAL WEIGHT:       2745  grams, 58 % ANATOMICAL SURVEY                                                                            COMMENTS CEREBRAL VENTRICLES    CHOROID PLEXUS    CEREBELLUM    CISTERNA MAGNA    NUCHAL REGION    ORBITS    NASAL BONE    NOSE/LIP yes normal  FACIAL PROFILE yes normal  4 CHAMBERED HEART    OUTFLOW TRACTS    DIAPHRAGM yes normal  STOMACH yes normal  RENAL REGION yes normal  BLADDER yes normal  CORD INSERTION    3 VESSEL CORD    SPINE    ARMS/HANDS    LEGS/FEET    GENITALIA yes normal female     SUSPECTED ABNORMALITIES:  no QUALITY OF SCAN: Limited ultrasound because of pt body habitus TECHNICIAN COMMENTS: [place] 35wks,breech,post pl gr 2,BPP 8/8,AFI 16.6 cm,RI .59,.60,EFW 2745 g,58% A copy of this report including all images has been saved and backed up to a second source for retrieval if needed. All measures and details of the anatomical scan, placentation, fluid volume and pelvic anatomy are contained in that report. Sara Vargas 07/30/2015 12:27 PM  Clinical Impression and recommendations: I have reviewed the sonogram results above. Combined with the patient's current clinical course, below are my impressions and any appropriate recommendations for management based on the sonographic findings: 1.fetal wellbeing appears present, with specifically reassuring fetal assessment with good BPP, fluid and excellent Doppler flow 2. BREECH presentation, nearing 36wks, when consideration of attempted External version(ECV) to be discussed with patient. Initial discussion of ECV has happened, will discuss further at next week appt. Pt obesity  is an factor in reduced success rates re: ECV. 3 Normal growth at 58%ile for gest age. Sara Vargas   [place] Fetal Bpp W/o Non Stress  07/26/2015  FOLLOW UP SONOGRAM Sara Vargas is in the office for a follow up sonogram for BPP and cord doppler. She is a 25 y.o. year old G1P0 with Estimated Date of Delivery: 09/03/15 by early ultrasound now at  3256w1d weeks gestation. Thus far the pregnancy has been complicated by obesity,CHTN,abn.genetic testing.Marland Kitchen. GESTATION: SINGLETON PRESENTATION: cephalic FETAL ACTIVITY:          Heart rate         148          The fetus is active. AMNIOTIC FLUID: The amniotic fluid volume is  normal, 15.4 cm. PLACENTA LOCALIZATION:  posterior GRADE 1 CERVIX: Limited view ADNEXA: wnl GESTATIONAL AGE AND  BIOMETRICS: Gestational criteria: Estimated Date of Delivery: 09/03/15 by early ultrasound now at 2256w1d Previous Scans:6 BIOPHYSCIAL PROFILE:                                                                                                      COMMENTS GROSS BODY MOVEMENT                 2  TONE                2  RESPIRATIONS                2  AMNIOTIC FLUID                2                                                          SCORE:  8/8 (Note: NST was not performed as part of this antepartum testing) DOPPLER FLOW STUDIES: UMBILICAL ARTERY RI RATIOS:   0.61, 0.57 ANATOMICAL SURVEY                                                                             COMMENTS CEREBRAL VENTRICLES    CHOROID PLEXUS    CEREBELLUM    CISTERNA MAGNA    NUCHAL REGION    ORBITS    NASAL BONE    NOSE/LIP    FACIAL PROFILE yes normal  4 CHAMBERED HEART    OUTFLOW TRACTS    DIAPHRAGM yes normal  STOMACH yes normal  RENAL REGION yes normal  BLADDER yes normal  CORD INSERTION    3 VESSEL CORD    SPINE    ARMS/HANDS    LEGS/FEET    GENITALIA  female     SUSPECTED ABNORMALITIES:  no QUALITY OF SCAN: Limited ultrasound because of pt body habitus TECHNICIAN COMMENTS: [place] 34wks,post pl gr 1,RI .61,.57,AFI 15.4cm,bilat adnexa's wnl,BPP 8/8,FHR 148 BPM,cephalic    A copy of this report including all images has been saved and backed up to a second source for retrieval if needed. All measures and details of the anatomical scan, placentation, fluid volume and pelvic anatomy are contained in that report. Sara Vargas 07/24/2015 1:32 PM Clinical Impression and recommendations: I have reviewed the sonogram results above, combined with the patient's current clinical course, below are my impressions and any appropriate recommendations for management based on the sonographic findings. 1.  G1P0 Estimated Date of Delivery: 09/03/15 by serial sonographic evaluations 2.  Fetal sonographic surveillance findings: a). Normal fluid volume b). Normal antepartum fetal assessment with BPP 8/8 c). Normal fetal Doppler ratios with consistent diastolic flow 3.  Normal general sonographic findings Recommend continued prenatal evaluations and care based on this sonogram and as clinically indicated from the patient's clinical course. EURE,LUTHER H 07/26/2015 12:52 AM   US Fetal Bpp W/o Non Stress  07/16/2015  FOLLOW UP SONOGRAM Sara Vargas is in the office for a follow up sonogram for EFW,BPP and cord doppler. She is a 25 y.o. year old G1P0 with Estimated Date of Delivery: 09/03/15 by early ultrasound now at  [redacted]w[redacted]d weeks gestation. Thus far the pregnancy has been complicated  by obesity,chtn,abnormal genetic testing.Marland Kitchen GESTATION: SINGLETON PRESENTATION: Transverse head rt FETAL ACTIVITY:          Heart rate         138 bpm          The fetus is active. AMNIOTIC FLUID: The amniotic fluid volume is  normal, 18 cm. PLACENTA LOCALIZATION:  posterior GRADE 1 CERVIX: Limited view ADNEXA: wnl GESTATIONAL AGE AND  BIOMETRICS: Gestational criteria: Estimated Date of Delivery: 09/03/15 by early ultrasound now at [redacted]w[redacted]d Previous Scans:5          BIPARIETAL DIAMETER           8.29 cm         33+4 weeks HEAD CIRCUMFERENCE           30.45 cm         33+6 weeks ABDOMINAL CIRCUMFERENCE           29.07 cm         33+1 weeks FEMUR LENGTH           6.26 cm         32+3 weeks                                                       AVERAGE EGA(BY THIS SCAN):  33+2 weeks                                                 ESTIMATED FETAL WEIGHT:       2111  grams, 47 % BIOPHYSCIAL PROFILE:  COMMENTS GROSS BODY MOVEMENT                 2  TONE                2  RESPIRATIONS                2  AMNIOTIC FLUID                2                                                          SCORE:  8/8 (Note: NST was not performed as part of this antepartum testing) DOPPLER FLOW STUDIES: UMBILICAL ARTERY RI RATIOS:   0.55, 0.45 ANATOMICAL SURVEY                                                                            COMMENTS CEREBRAL VENTRICLES yes normal  CHOROID PLEXUS yes normal  CEREBELLUM    CISTERNA MAGNA    NUCHAL REGION    ORBITS    NASAL BONE    NOSE/LIP yes normal  FACIAL PROFILE yes normal  4 CHAMBERED HEART    OUTFLOW TRACTS    DIAPHRAGM yes normal  STOMACH yes normal  RENAL REGION yes normal  BLADDER yes normal  CORD INSERTION    3 VESSEL CORD    SPINE yes normal  ARMS/HANDS    LEGS/FEET    GENITALIA yes normal female     SUSPECTED ABNORMALITIES:  no QUALITY OF SCAN: Limited view because of pt body habitus TECHNICIAN  COMMENTS: [place] 33wks,trans head rt,post pl gr 1,bilat adnexa's wnl,BPP 8/8,efw 2111g 47%,afi 18%,RI .55,.45,FHR 138 BPM A copy of this report including all images has been saved and backed up to a second source for retrieval if needed. All measures and details of the anatomical scan, placentation, fluid volume and pelvic anatomy are contained in that report. Sara Vargas 07/16/2015 12:23 PM Clinical Impression and recommendations: I have reviewed the sonogram results above, combined with the patient's current clinical course, below are my impressions and any appropriate recommendations for management based on the sonographic findings. 1.  G1P0 Estimated Date of Delivery: 09/03/15 by serial sonographic evaluations 2.  Fetal sonographic surveillance findings: a). Normal fluid volume b). Normal antepartum fetal assessment with BPP 8/8 c). Normal fetal Doppler ratios with consistent diastolic flow d). Normal growth percentile with appropriate interval growth 3.  Normal general sonographic findings Recommend continued prenatal evaluations and care based on this sonogram and as clinically indicated from the patient's clinical course. Lazaro Arms 07/16/2015 12:28 PM   [place] Ua Cord Doppler  08/07/2015  FOLLOW UP SONOGRAM Sara Vargas is in the office for a follow up sonogram for BPP and cord doppler. She is a 25 y.o. year old G1P0 with Estimated Date of Delivery: 09/03/15 by early ultrasound now at  [redacted]w[redacted]d weeks gestation. Thus far the pregnancy has been complicated by obesity,chtn,abnormal genetic test.. GESTATION: SINGLETON PRESENTATION: breech complete FETAL ACTIVITY:  Heart rate         151          The fetus is active. AMNIOTIC FLUID: The amniotic fluid volume is  normal, 20 cm. PLACENTA LOCALIZATION:  posterior GRADE 2 CERVIX: Limited view ADNEXA: wnl GESTATIONAL AGE AND  BIOMETRICS: Gestational criteria: Estimated Date of Delivery: 09/03/15 by early ultrasound now at [redacted]w[redacted]d Previous Scans:8 BIOPHYSCIAL PROFILE:                                                                                                       COMMENTS GROSS BODY MOVEMENT                 2  TONE                2  RESPIRATIONS                2  AMNIOTIC FLUID                2                                                          SCORE:  8/8 (Note: NST was not performed as part of this antepartum testing) DOPPLER FLOW STUDIES: UMBILICAL ARTERY RI RATIOS:   0.60, 0.69 ANATOMICAL SURVEY                                                                            COMMENTS CEREBRAL VENTRICLES yes normal  CHOROID PLEXUS yes normal  CEREBELLUM    CISTERNA MAGNA    NUCHAL REGION    ORBITS    NASAL BONE    NOSE/LIP    FACIAL PROFILE    4 CHAMBERED HEART    OUTFLOW TRACTS    DIAPHRAGM yes normal  STOMACH yes normal  RENAL REGION yes normal  BLADDER yes normal  CORD INSERTION    3 VESSEL CORD    SPINE    ARMS/HANDS    LEGS/FEET    GENITALIA   female     SUSPECTED ABNORMALITIES:  yes breech QUALITY OF SCAN: Limited view because of pt body habitus TECHNICIAN COMMENTS: [place] 36 wks,breech,post pl gr 2,bilat adnexa's wnl,BPP 8/8,RI .60,.69,FHR 151 bpm,afi 20cm A copy of this report including all images has been saved and backed up to a second source for retrieval if needed. All measures and details of the anatomical scan, placentation, fluid volume and pelvic anatomy are contained in that report. Sara Vargas 08/06/2015 1:54 PM Clinical Impression and recommendations: I have reviewed the sonogram results above. Combined with the patient's current  clinical course, below are my impressions and any appropriate recommendations for management based on the sonographic findings: 1. Breech presentation, in a massively obese patient, to be considered for external version. 2. Generally healthy fetal status, specifically reassuring fetal assessment with good BPP, fluid and excellent Doppler flow 3 will attempt external version at 37 wk. Sara Vargas   [place] Ua Cord Doppler  07/31/2015   FOLLOW UP SONOGRAM Sara Vargas is in the office for a follow up sonogram for BPP,EFW and cord doppler. She is a 25 y.o. year old G1P0 with Estimated Date of Delivery: 09/03/15 by early ultrasound now at  [redacted]w[redacted]d weeks gestation. Thus far the pregnancy has been complicated by abnormal genetic testing,obesity,chtn.. GESTATION: SINGLETON PRESENTATION: breech frank FETAL ACTIVITY:          Heart rate         146          The fetus is active. AMNIOTIC FLUID: The amniotic fluid volume is  normal, 16.5 cm. PLACENTA LOCALIZATION:  posterior GRADE 2 CERVIX: Limited view ADNEXA: wnl GESTATIONAL AGE AND  BIOMETRICS: Gestational criteria: Estimated Date of Delivery: 09/03/15 by early ultrasound now at [redacted]w[redacted]d Previous Scans:7          BIPARIETAL DIAMETER           8.74 cm         35+2 weeks HEAD CIRCUMFERENCE           31.70 cm         35+4 weeks ABDOMINAL CIRCUMFERENCE           31.81 cm         35+5 weeks FEMUR LENGTH           6.83 cm         35+1 weeks                                                       AVERAGE EGA(BY THIS SCAN):  35+3 weeks                                                 ESTIMATED FETAL WEIGHT:       2745  grams, 58 % ANATOMICAL SURVEY                                                                            COMMENTS CEREBRAL VENTRICLES    CHOROID PLEXUS    CEREBELLUM    CISTERNA MAGNA    NUCHAL REGION    ORBITS    NASAL BONE    NOSE/LIP yes normal  FACIAL PROFILE yes normal  4 CHAMBERED HEART    OUTFLOW TRACTS    DIAPHRAGM yes normal  STOMACH yes normal  RENAL REGION yes normal  BLADDER yes normal  CORD INSERTION    3 VESSEL CORD    SPINE    ARMS/HANDS    LEGS/FEET  GENITALIA yes normal female     SUSPECTED ABNORMALITIES:  no QUALITY OF SCAN: Limited ultrasound because of pt body habitus TECHNICIAN COMMENTS: [place] 35wks,breech,post pl gr 2,BPP 8/8,AFI 16.6 cm,RI .59,.60,EFW 2745 g,58% A copy of this report including all images has been saved and backed up to a second source for retrieval if needed. All  measures and details of the anatomical scan, placentation, fluid volume and pelvic anatomy are contained in that report. Sara Vargas 07/30/2015 12:27 PM Clinical Impression and recommendations: I have reviewed the sonogram results above. Combined with the patient's current clinical course, below are my impressions and any appropriate recommendations for management based on the sonographic findings: 1.fetal wellbeing appears present, with specifically reassuring fetal assessment with good BPP, fluid and excellent Doppler flow 2. BREECH presentation, nearing 36wks, when consideration of attempted External version(ECV) to be discussed with patient. Initial discussion of ECV has happened, will discuss further at next week appt. Pt obesity is an factor in reduced success rates re: ECV. 3 Normal growth at 58%ile for gest age. Sara Vargas   [place] Ua Cord Doppler  07/26/2015  FOLLOW UP SONOGRAM Sara Vargas is in the office for a follow up sonogram for BPP and cord doppler. She is a 25 y.o. year old G1P0 with Estimated Date of Delivery: 09/03/15 by early ultrasound now at  [redacted]w[redacted]d weeks gestation. Thus far the pregnancy has been complicated by obesity,CHTN,abn.genetic testing.Marland Kitchen GESTATION: SINGLETON PRESENTATION: cephalic FETAL ACTIVITY:          Heart rate         148          The fetus is active. AMNIOTIC FLUID: The amniotic fluid volume is  normal, 15.4 cm. PLACENTA LOCALIZATION:  posterior GRADE 1 CERVIX: Limited view ADNEXA: wnl GESTATIONAL AGE AND  BIOMETRICS: Gestational criteria: Estimated Date of Delivery: 09/03/15 by early ultrasound now at [redacted]w[redacted]d Previous Scans:6 BIOPHYSCIAL PROFILE:                                                                                                      COMMENTS GROSS BODY MOVEMENT                 2  TONE                2  RESPIRATIONS                2  AMNIOTIC FLUID                2                                                          SCORE:  8/8 (Note: NST was not performed as  part of this antepartum testing) DOPPLER FLOW STUDIES: UMBILICAL ARTERY RI RATIOS:   0.61, 0.57 ANATOMICAL SURVEY  COMMENTS CEREBRAL VENTRICLES    CHOROID PLEXUS    CEREBELLUM    CISTERNA MAGNA    NUCHAL REGION    ORBITS    NASAL BONE    NOSE/LIP    FACIAL PROFILE yes normal  4 CHAMBERED HEART    OUTFLOW TRACTS    DIAPHRAGM yes normal  STOMACH yes normal  RENAL REGION yes normal  BLADDER yes normal  CORD INSERTION    3 VESSEL CORD    SPINE    ARMS/HANDS    LEGS/FEET    GENITALIA   female     SUSPECTED ABNORMALITIES:  no QUALITY OF SCAN: Limited ultrasound because of pt body habitus TECHNICIAN COMMENTS: [place] 34wks,post pl gr 1,RI .61,.57,AFI 15.4cm,bilat adnexa's wnl,BPP 8/8,FHR 148 BPM,cephalic    A copy of this report including all images has been saved and backed up to a second source for retrieval if needed. All measures and details of the anatomical scan, placentation, fluid volume and pelvic anatomy are contained in that report. Sara Vargas 07/24/2015 1:32 PM Clinical Impression and recommendations: I have reviewed the sonogram results above, combined with the patient's current clinical course, below are my impressions and any appropriate recommendations for management based on the sonographic findings. 1.  G1P0 Estimated Date of Delivery: 09/03/15 by serial sonographic evaluations 2.  Fetal sonographic surveillance findings: a). Normal fluid volume b). Normal antepartum fetal assessment with BPP 8/8 c). Normal fetal Doppler ratios with consistent diastolic flow 3.  Normal general sonographic findings Recommend continued prenatal evaluations and care based on this sonogram and as clinically indicated from the patient's clinical course. Lazaro Arms 07/26/2015 12:52 AM   [place] Ua Cord Doppler  07/16/2015  FOLLOW UP SONOGRAM Sara Vargas is in the office for a follow up sonogram for EFW,BPP and cord doppler. She is a 25 y.o. year old G1P0  with Estimated Date of Delivery: 09/03/15 by early ultrasound now at  [redacted]w[redacted]d weeks gestation. Thus far the pregnancy has been complicated by obesity,chtn,abnormal genetic testing.Marland Kitchen GESTATION: SINGLETON PRESENTATION: Transverse head rt FETAL ACTIVITY:          Heart rate         138 bpm          The fetus is active. AMNIOTIC FLUID: The amniotic fluid volume is  normal, 18 cm. PLACENTA LOCALIZATION:  posterior GRADE 1 CERVIX: Limited view ADNEXA: wnl GESTATIONAL AGE AND  BIOMETRICS: Gestational criteria: Estimated Date of Delivery: 09/03/15 by early ultrasound now at [redacted]w[redacted]d Previous Scans:5          BIPARIETAL DIAMETER           8.29 cm         33+4 weeks HEAD CIRCUMFERENCE           30.45 cm         33+6 weeks ABDOMINAL CIRCUMFERENCE           29.07 cm         33+1 weeks FEMUR LENGTH           6.26 cm         32+3 weeks                                                       AVERAGE EGA(BY THIS SCAN):  33+2 weeks  ESTIMATED FETAL WEIGHT:       2111  grams, 47 % BIOPHYSCIAL PROFILE:                                                                                                      COMMENTS GROSS BODY MOVEMENT                 2  TONE                2  RESPIRATIONS                2  AMNIOTIC FLUID                2                                                          SCORE:  8/8 (Note: NST was not performed as part of this antepartum testing) DOPPLER FLOW STUDIES: UMBILICAL ARTERY RI RATIOS:   0.55, 0.45 ANATOMICAL SURVEY                                                                            COMMENTS CEREBRAL VENTRICLES yes normal  CHOROID PLEXUS yes normal  CEREBELLUM    CISTERNA MAGNA    NUCHAL REGION    ORBITS    NASAL BONE    NOSE/LIP yes normal  FACIAL PROFILE yes normal  4 CHAMBERED HEART    OUTFLOW TRACTS    DIAPHRAGM yes normal  STOMACH yes normal  RENAL REGION yes normal  BLADDER yes normal  CORD INSERTION    3 VESSEL CORD    SPINE yes normal  ARMS/HANDS     LEGS/FEET    GENITALIA yes normal female     SUSPECTED ABNORMALITIES:  no QUALITY OF SCAN: Limited view because of pt body habitus TECHNICIAN COMMENTS: [place] 33wks,trans head rt,post pl gr 1,bilat adnexa's wnl,BPP 8/8,efw 2111g 47%,afi 18%,RI .55,.45,FHR 138 BPM A copy of this report including all images has been saved and backed up to a second source for retrieval if needed. All measures and details of the anatomical scan, placentation, fluid volume and pelvic anatomy are contained in that report. Sara Vargas 07/16/2015 12:23 PM Clinical Impression and recommendations: I have reviewed the sonogram results above, combined with the patient's current clinical course, below are my impressions and any appropriate recommendations for management based on the sonographic findings. 1.  G1P0 Estimated Date of Delivery: 09/03/15 by serial sonographic evaluations 2.  Fetal sonographic surveillance findings: a). Normal fluid volume b). Normal antepartum fetal assessment with BPP 8/8 c). Normal fetal  Doppler ratios with consistent diastolic flow d). Normal growth percentile with appropriate interval growth 3.  Normal general sonographic findings Recommend continued prenatal evaluations and care based on this sonogram and as clinically indicated from the patient's clinical course. Lazaro Arms 07/16/2015 12:28 PM    MDM  Sara Budds is  25 y.o. G1P0 at [redacted]w[redacted]d presents with swelling and elevated blood pressure. Initial bp moderately elevated, but all subsequents wnl. Currently without symptoms. Will check preeclampsia labs.  20:00 sign-out to [employer] [facility] 3/15/20177:21 PM

## 2015-08-08 NOTE — Progress Notes (Signed)
Patient ID: Sara Vargas, female   DOB: 07/25/1990, 25 y.o.   MRN: [id number]  Pt is 36 wks preg.  Called in with complaint of elevated bp 154/84 and discomfort in chest.  She also complained of swelling.  Spoke with Chrystal, RN about pt.  [facility]  08-08-15  AS

## 2015-08-08 NOTE — Discharge Instructions (Signed)

## 2015-08-08 NOTE — MAU Note (Addendum)
High risk for pre-eclampsia. 157/84earlier, is on Labetalol, 36wks.  Really swollen today.  Has had some discomfort in chest, comes and goes.  Baby is breech,scheduled for version on Monday.. Slight headache, denies visual changes, no RUQ pain, has noted an increase in swelling.

## 2015-08-08 NOTE — Progress Notes (Signed)
History   CSN: [id number]  Arrival date and time: 08/08/15 1757  Provider initiated contact at 7:06 pm  Chief Complaint  Patient presents with  . Leg Swelling  . Hypertension  . Headache  . Chest Pain   HPI  Sara Vargas is a 25 yo G1P0 with IUP and chronic hypertension at 8053w2d presenting to the MAU today for hand and leg swelling, LUQ pain, and elevated blood pressure. The patient states that she has noticed increased swelling of her hands and feet bilaterally over the past 3 days that has not resolved. She endorses numbness and tingling sensations in her right hand only. She denies pain, and endorses stiffness with the swelling. The patient works as a LawyerCNA and checked her blood pressure this afternoon around 2 pm and measured it at 157/84. The patient has a history of chronic hypertension, for which she takes labetolol 200 mg bid. The patient endorses a singular episode of stabbing LUQ pain earlier this afternoon that has resolved post-bowel movement. This pain has not recurred. The patient endorses an 8 lb weight gain since Monday of this week, which she attributes to fluid retention. The patient denies headache, vision changes, current chest pain, SOB, palpitations, nausea, vomiting, diarrhea, constipation, contractions, vaginal bleeding or fluid loss, polyuria, polydipsia, frequency, urgency, and pain/burning with urination. The patient endorses positive fetal movement. The patient states that the fetus is in breech or transverse position and is scheduled for ECV on 3/19.  OB History    Gravida Para Term Preterm AB TAB SAB Ectopic Multiple Living   1               Past Medical History  Diagnosis Date  . Hypertension     labetolol 200 bid  . Anxiety   . Depression     Past Surgical History  Procedure Laterality Date  . Tonsillectomy    . Arm surgery    . Ureteral exploration      Family History  Problem Relation Age of Onset  . Heart disease Mother   . COPD Mother   .  Asthma Mother   . Other Father     benign brain tumor  . Heart disease Maternal Grandmother     Social History  Substance Use Topics  . Smoking status: Former Smoker -- 6.00 packs/day    Types: Cigarettes    Quit date: 03/09/2015  . Smokeless tobacco: Never Used  . Alcohol Use: No    Allergies: No Known Allergies  Prescriptions prior to admission  Medication Sig Dispense Refill Last Dose  . acetaminophen (TYLENOL) 500 MG tablet Take 1,000 mg by mouth every 6 (six) hours as needed for moderate pain.   Past Month at Unknown time  . aspirin 81 MG tablet Take 81 mg by mouth daily. Reported on 06/13/2015   Past Month at Unknown time  . labetalol (NORMODYNE) 200 MG tablet Take 1 tablet (200 mg total) by mouth 2 (two) times daily. 60 tablet 3 08/08/2015 at 1400  . Prenatal Vit-Fe Fumarate-FA (MULTIVITAMIN-PRENATAL) 27-0.8 MG TABS tablet Take 1 tablet by mouth daily at 12 noon. 30 each 11 08/08/2015 at Unknown time   Review of Systems  Constitutional: Negative for fever, chills, weight loss, malaise/fatigue and diaphoresis.  HENT: Negative for congestion, nosebleeds and sore throat.   Eyes: Negative for blurred vision and double vision.  Respiratory: Negative for cough, shortness of breath and wheezing.   Cardiovascular: Positive for leg swelling. Negative for chest pain, palpitations and claudication.  Gastrointestinal: Positive for abdominal pain. Negative for heartburn, nausea, vomiting, diarrhea, constipation and blood in stool.  Genitourinary: Negative for dysuria, urgency, frequency, hematuria and flank pain.  Musculoskeletal: Negative for myalgias and back pain.  Skin: Negative for itching and rash.  Neurological: Positive for tingling and headaches. Negative for dizziness, focal weakness, seizures, loss of consciousness and weakness.  Endo/Heme/Allergies: Negative for polydipsia. Does not bruise/bleed easily.     Physical Exam   Blood pressure 124/80, pulse 94, temperature 98.8  F (37.1 C), temperature source Oral, resp. rate 18, height  (1.651 m), weight 171.369 kg (377 lb 12.8 oz), last menstrual period 10/18/2014, SpO2 100 %.  Physical Exam  Constitutional: She is oriented to person, place, and time. She appears well-developed and well-nourished. No distress.  HENT:  Head: Normocephalic and atraumatic.  Mouth/Throat: Oropharynx is clear and moist and mucous membranes are normal.  Eyes: Conjunctivae and EOM are normal. Pupils are equal, round, and reactive to light. No scleral icterus.  Neck: Trachea normal and normal range of motion. Neck supple. Carotid bruit is not present.  Cardiovascular: Normal rate, regular rhythm, S1 normal, S2 normal, normal heart sounds, intact distal pulses and normal pulses.  Exam reveals no gallop and no friction rub.   No murmur heard. Pulses:      Radial pulses are 2+ on the right side, and 2+ on the left side.       Dorsalis pedis pulses are 2+ on the right side, and 2+ on the left side.       Posterior tibial pulses are 2+ on the right side, and 2+ on the left side.  Respiratory: Effort normal and breath sounds normal. No respiratory distress. She has no wheezes. She has no rales. She exhibits no tenderness.  GI: Soft. Normal appearance and bowel sounds are normal. There is no tenderness. There is no rebound, no guarding and no CVA tenderness.  Musculoskeletal: Normal range of motion. She exhibits edema. She exhibits no tenderness.  Lymphadenopathy:    She has no cervical adenopathy.  Non-pitting edema of the fingers and hand up to wrist bilaterally Non-pitting edema of the toes, feet, and ankle up to mid-shin bilaterally  Neurological: She is alert and oriented to person, place, and time. She has normal strength and normal reflexes. No cranial nerve deficit or sensory deficit.  Reflex Scores:      Tricep reflexes are 2+ on the right side and 2+ on the left side.      Bicep reflexes are 2+ on the right side and 2+ on the  left side.      Brachioradialis reflexes are 2+ on the right side and 2+ on the left side.      Patellar reflexes are 2+ on the right side and 2+ on the left side.      Achilles reflexes are 2+ on the right side and 2+ on the left side. Skin: Skin is warm and intact. No ecchymosis, no petechiae and no rash noted. She is not diaphoretic. No erythema. No pallor.  Psychiatric: She has a normal mood and affect. Her behavior is normal.    MAU Course  Procedures  MDM Patient has a history of chronic hypertension for which she takes labetolol bid; is compliant with her medication. Currently in the third trimester of pregnancy. BP since admission to MAU averaging 124/90. Patient asymptomatic on presentation and during duration of stay thus far.   Results for orders placed or performed during the hospital encounter of  08/08/15 (from the past 24 hour(s))  Urinalysis, Routine w reflex microscopic (not at [facility])     Status: Abnormal   Collection Time: 08/08/15  6:30 PM  Result Value Ref Range   Color, Urine YELLOW YELLOW   APPearance HAZY (A) CLEAR   Specific Gravity, Urine 1.025 1.005 - 1.030   pH 6.0 5.0 - 8.0   Glucose, UA NEGATIVE NEGATIVE mg/dL   Hgb urine dipstick NEGATIVE NEGATIVE   Bilirubin Urine NEGATIVE NEGATIVE   Ketones, ur NEGATIVE NEGATIVE mg/dL   Protein, ur NEGATIVE NEGATIVE mg/dL   Nitrite NEGATIVE NEGATIVE   Leukocytes, UA NEGATIVE NEGATIVE    Assessment and Plan  Third trimester pregnancy Chronic hypertension Extremity edema  - Urinalysis, CBC, CMP, P:C ordered; results pending - Monitor blood pressures and fetal heart tracing - Call attending for increase in BP >140/90 or if patient starts experiencing changes in vision, acute headache, RUQ pain  Cephus Shelling PA-S 08/08/2015, 7:29 PM

## 2015-08-09 ENCOUNTER — Ambulatory Visit (INDEPENDENT_AMBULATORY_CARE_PROVIDER_SITE_OTHER): Payer: Medicaid Other | Admitting: Obstetrics and Gynecology

## 2015-08-09 ENCOUNTER — Encounter: Payer: Self-pay | Admitting: Obstetrics and Gynecology

## 2015-08-09 VITALS — BP 154/92 | HR 92 | Wt 376.0 lb

## 2015-08-09 DIAGNOSIS — O10913 Unspecified pre-existing hypertension complicating pregnancy, third trimester: Secondary | ICD-10-CM

## 2015-08-09 DIAGNOSIS — O280 Abnormal hematological finding on antenatal screening of mother: Secondary | ICD-10-CM

## 2015-08-09 DIAGNOSIS — O10919 Unspecified pre-existing hypertension complicating pregnancy, unspecified trimester: Secondary | ICD-10-CM

## 2015-08-09 DIAGNOSIS — O09893 Supervision of other high risk pregnancies, third trimester: Secondary | ICD-10-CM | POA: Diagnosis not present

## 2015-08-09 DIAGNOSIS — O99213 Obesity complicating pregnancy, third trimester: Secondary | ICD-10-CM

## 2015-08-09 DIAGNOSIS — Z3A37 37 weeks gestation of pregnancy: Secondary | ICD-10-CM

## 2015-08-09 DIAGNOSIS — O321XX1 Maternal care for breech presentation, fetus 1: Secondary | ICD-10-CM | POA: Diagnosis not present

## 2015-08-09 DIAGNOSIS — Z331 Pregnant state, incidental: Secondary | ICD-10-CM

## 2015-08-09 DIAGNOSIS — Z1389 Encounter for screening for other disorder: Secondary | ICD-10-CM | POA: Diagnosis not present

## 2015-08-09 LAB — POCT URINALYSIS DIPSTICK
GLUCOSE UA: NEGATIVE
Ketones, UA: NEGATIVE
LEUKOCYTES UA: NEGATIVE
NITRITE UA: NEGATIVE
PROTEIN UA: NEGATIVE

## 2015-08-12 NOTE — Progress Notes (Signed)
Patient ID: Sara BuddsHannah G Parma, female   DOB: 02/11/1991, 25 y.o.   MRN: [id number]  High Risk Pregnancy Diagnosis(es):   CHTN  G1P0 8462w6d Estimated Date of Delivery: 09/03/15    HPI: The patient is being seen today for ongoing management of Chtn, morbid obesity, BREECH at 36 wk, . Today she reports she has been scheduled for attempt at ECV (version) next Monday, 37 wk. Planned procedure reviewed. Pt aware that her size is a limitation to success rate for ECV. Patient reports good fetal movement, denies any bleeding and no rupture of membranes symptoms or regular contractions.   BP weight and urine results reviewed and noted. Blood pressure 154/92, pulse 92, weight 170.552 kg (376 lb), last menstrual period 10/18/2014.  Fetal Surveillance Testing today:  NST reactive Fundal Height:  40 Fetal Heart rate:  145 Edema:  moderatee Urinalysis: Negative   Questions were answered.  Lab and sonogram results have been reviewed. Comments: normal   Assessment:  1.  Pregnancy at 2962w6d,  Estimated Date of Delivery: 09/03/15 :                          2.  Breech                         3. CHTN morbid obesity  Medication(s) Plans:  Labetalol continues  Treatment Plan:  ECV to be attempted at 37 wk , success estimate <25 %  Follow up in 0.5 weeks for appointment for high risk OB care, version

## 2015-08-13 ENCOUNTER — Encounter (HOSPITAL_COMMUNITY): Payer: Self-pay

## 2015-08-13 ENCOUNTER — Observation Stay (HOSPITAL_COMMUNITY)
Admission: RE | Admit: 2015-08-13 | Discharge: 2015-08-13 | Disposition: A | Discharge status: Home / Self Care | Payer: Medicaid Other | Source: Ambulatory Visit | Attending: Obstetrics and Gynecology | Admitting: Obstetrics and Gynecology

## 2015-08-13 VITALS — BP 117/79 | HR 77 | Temp 98.4°F | Resp 17

## 2015-08-13 DIAGNOSIS — Z3A37 37 weeks gestation of pregnancy: Secondary | ICD-10-CM | POA: Diagnosis not present

## 2015-08-13 DIAGNOSIS — Z87891 Personal history of nicotine dependence: Secondary | ICD-10-CM | POA: Diagnosis not present

## 2015-08-13 DIAGNOSIS — O321XX0 Maternal care for breech presentation, not applicable or unspecified: Secondary | ICD-10-CM | POA: Diagnosis present

## 2015-08-13 DIAGNOSIS — O99213 Obesity complicating pregnancy, third trimester: Secondary | ICD-10-CM | POA: Diagnosis not present

## 2015-08-13 DIAGNOSIS — F418 Other specified anxiety disorders: Secondary | ICD-10-CM | POA: Insufficient documentation

## 2015-08-13 DIAGNOSIS — O10013 Pre-existing essential hypertension complicating pregnancy, third trimester: Secondary | ICD-10-CM | POA: Diagnosis not present

## 2015-08-13 DIAGNOSIS — O99820 Streptococcus B carrier state complicating pregnancy: Secondary | ICD-10-CM | POA: Insufficient documentation

## 2015-08-13 DIAGNOSIS — O99343 Other mental disorders complicating pregnancy, third trimester: Secondary | ICD-10-CM | POA: Diagnosis not present

## 2015-08-13 DIAGNOSIS — Z6841 Body Mass Index (BMI) 40.0 and over, adult: Secondary | ICD-10-CM

## 2015-08-13 DIAGNOSIS — O280 Abnormal hematological finding on antenatal screening of mother: Secondary | ICD-10-CM

## 2015-08-13 DIAGNOSIS — Z538 Procedure and treatment not carried out for other reasons: Secondary | ICD-10-CM | POA: Insufficient documentation

## 2015-08-13 DIAGNOSIS — O09893 Supervision of other high risk pregnancies, third trimester: Secondary | ICD-10-CM

## 2015-08-13 MED ORDER — TERBUTALINE SULFATE 1 MG/ML IJ SOLN
INTRAMUSCULAR | Status: AC
  Filled 2015-08-13: qty 1

## 2015-08-13 NOTE — H&P (Signed)
Sara BuddsHannah G Ziehm is a 25 y.o. female presenting for attempt at external cephalic version. [facility], and morbid obesity, is noted to be breech, confirmed by u/s last week. She has had external verison reviewed , including limitations of effort based on her obesity and thick abd girth. Rationale, risk, benefits reviewed. Pt will have external version under terbutalene tocolysis.Marland Kitchen. Upon arrival , the Pt reports that the baby has been more active this weekend, and u/s reveals vertex presentation , a change from Thursday. Maternal Medical History:  Fetal activity: Perceived fetal activity is normal.      OB History    Gravida Para Term Preterm AB TAB SAB Ectopic Multiple Living   1              Past Medical History  Diagnosis Date  . Hypertension     labetolol 200 bid  . Anxiety   . Depression    Past Surgical History  Procedure Laterality Date  . Tonsillectomy    . Arm surgery    . Ureteral exploration     Family History: family history includes Asthma in her mother; COPD in her mother; Heart disease in her maternal grandmother and mother; Other in her father. Social History:  reports that she quit smoking about 5 months ago. Her smoking use included Cigarettes. She smoked 6.00 packs per day. She has never used smokeless tobacco. She reports that she does not drink alcohol or use illicit drugs.   Prenatal Transfer Tool  Maternal Diabetes: No Genetic Screening: Normal Maternal Ultrasounds/Referrals: Abnormal:  Findings:   Other: BREECH Fetal Ultrasounds or other Referrals:  None Maternal Substance Abuse:  No Significant Maternal Medications:  Meds include: Other: labetalol Significant Maternal Lab Results:  None Other Comments:  None  Review of Systems  Gastrointestinal: Negative.       Last menstrual period 10/18/2014. Exam Physical Exam  Prenatal labs: ABO, Rh: A/Negative/-- (08/25 1221) Antibody: Negative (01/18 1038) Rubella: 3.03  (08/25 1221) RPR: Non Reactive (01/18 1038)  HBsAg: Negative (08/25 1221)  HIV: Non Reactive (01/18 1038)  GBS: Positive (03/13 1500)  Physical Examination: General appearance - alert, well appearing, and in no distress, oriented to person, place, and time and overweight Mental status - alert, oriented to person, place, and time, normal mood, behavior, speech, dress, motor activity, and thought processes Abdomen - soft, nontender, nondistended, no masses or organomegaly Gravid uterus 40 cm with u/s confirmed vertex presentation  Assessment/Plan: Pregnancy 37 wk CHTN, resolved Breech presentation. Reactive NST   Diar Berkel V 08/13/2015, 7:10 AM

## 2015-08-15 ENCOUNTER — Other Ambulatory Visit: Payer: Self-pay | Admitting: Obstetrics & Gynecology

## 2015-08-15 ENCOUNTER — Other Ambulatory Visit: Payer: Self-pay | Admitting: Obstetrics and Gynecology

## 2015-08-15 DIAGNOSIS — O10913 Unspecified pre-existing hypertension complicating pregnancy, third trimester: Secondary | ICD-10-CM

## 2015-08-15 DIAGNOSIS — O285 Abnormal chromosomal and genetic finding on antenatal screening of mother: Secondary | ICD-10-CM

## 2015-08-16 ENCOUNTER — Ambulatory Visit (INDEPENDENT_AMBULATORY_CARE_PROVIDER_SITE_OTHER): Payer: Medicaid Other

## 2015-08-16 ENCOUNTER — Ambulatory Visit (INDEPENDENT_AMBULATORY_CARE_PROVIDER_SITE_OTHER): Payer: Medicaid Other | Admitting: Obstetrics & Gynecology

## 2015-08-16 ENCOUNTER — Encounter: Payer: Self-pay | Admitting: Obstetrics & Gynecology

## 2015-08-16 ENCOUNTER — Other Ambulatory Visit: Payer: Medicaid Other | Admitting: Obstetrics & Gynecology

## 2015-08-16 VITALS — BP 120/80 | HR 92 | Wt 367.2 lb

## 2015-08-16 DIAGNOSIS — O10913 Unspecified pre-existing hypertension complicating pregnancy, third trimester: Secondary | ICD-10-CM | POA: Diagnosis not present

## 2015-08-16 DIAGNOSIS — O10912 Unspecified pre-existing hypertension complicating pregnancy, second trimester: Secondary | ICD-10-CM

## 2015-08-16 DIAGNOSIS — Z1389 Encounter for screening for other disorder: Secondary | ICD-10-CM

## 2015-08-16 DIAGNOSIS — O289 Unspecified abnormal findings on antenatal screening of mother: Secondary | ICD-10-CM

## 2015-08-16 DIAGNOSIS — O09893 Supervision of other high risk pregnancies, third trimester: Secondary | ICD-10-CM

## 2015-08-16 DIAGNOSIS — Z6841 Body Mass Index (BMI) 40.0 and over, adult: Secondary | ICD-10-CM

## 2015-08-16 DIAGNOSIS — Z331 Pregnant state, incidental: Secondary | ICD-10-CM

## 2015-08-16 DIAGNOSIS — O10919 Unspecified pre-existing hypertension complicating pregnancy, unspecified trimester: Secondary | ICD-10-CM

## 2015-08-16 DIAGNOSIS — O285 Abnormal chromosomal and genetic finding on antenatal screening of mother: Secondary | ICD-10-CM

## 2015-08-16 DIAGNOSIS — O280 Abnormal hematological finding on antenatal screening of mother: Secondary | ICD-10-CM

## 2015-08-16 LAB — POCT URINALYSIS DIPSTICK
Glucose, UA: NEGATIVE
Ketones, UA: NEGATIVE
Leukocytes, UA: NEGATIVE
NITRITE UA: NEGATIVE
Protein, UA: NEGATIVE
RBC UA: NEGATIVE

## 2015-08-16 NOTE — Progress Notes (Signed)
Fetal Surveillance Testing today:  BPP 8/8   High Risk Pregnancy Diagnosis(es):   Chronic HYpertension  G1P0 2442w3d Estimated Date of Delivery: 09/03/15  Blood pressure 120/80, pulse 92, weight 367 lb 3.2 oz (166.561 kg), last menstrual period 10/18/2014.  Urinalysis: negative   HPI: The patient is being seen today for ongoing management of chronic hypertension. Today she reports no problmes   BP weight and urine results all reviewed and noted. Patient reports good fetal movement, denies any bleeding and no rupture of membranes symptoms or regular contractions.  Fundal Height:  U+26 Fetal Heart rate:  155 Edema:  none  Patient is without complaints other than noted in her HPI. All questions were answered.  All lab and sonogram results have been reviewed. Comments:    Assessment:  1.  Pregnancy at 2342w3d,  Estimated Date of Delivery: 09/03/15 :                          2.  Chronic Hypertension                        3.    Medication(s) Plans:  Labetalol 200 BID  Treatment Plan:  Twice weekly surveillance  No Follow-up on file. for appointment for high risk OB care  No orders of the defined types were placed in this encounter.   Orders Placed This Encounter  Procedures  . POCT urinalysis dipstick

## 2015-08-16 NOTE — Progress Notes (Signed)
US 37+3WKS,cephalic,post pl gr 2,fhr 155 bpm,afi 19cm,BPP 8/8,RI .62,.58,EFW 3197 g 56%

## 2015-08-20 ENCOUNTER — Ambulatory Visit (INDEPENDENT_AMBULATORY_CARE_PROVIDER_SITE_OTHER): Payer: Medicaid Other | Admitting: Obstetrics & Gynecology

## 2015-08-20 ENCOUNTER — Encounter: Payer: Self-pay | Admitting: Obstetrics & Gynecology

## 2015-08-20 VITALS — BP 142/80 | HR 82 | Wt 370.8 lb

## 2015-08-20 DIAGNOSIS — Z1389 Encounter for screening for other disorder: Secondary | ICD-10-CM

## 2015-08-20 DIAGNOSIS — O10919 Unspecified pre-existing hypertension complicating pregnancy, unspecified trimester: Secondary | ICD-10-CM

## 2015-08-20 DIAGNOSIS — O10912 Unspecified pre-existing hypertension complicating pregnancy, second trimester: Secondary | ICD-10-CM | POA: Diagnosis not present

## 2015-08-20 DIAGNOSIS — Z331 Pregnant state, incidental: Secondary | ICD-10-CM | POA: Diagnosis not present

## 2015-08-20 DIAGNOSIS — O09893 Supervision of other high risk pregnancies, third trimester: Secondary | ICD-10-CM

## 2015-08-20 DIAGNOSIS — Z6841 Body Mass Index (BMI) 40.0 and over, adult: Secondary | ICD-10-CM

## 2015-08-20 LAB — POCT URINALYSIS DIPSTICK
Blood, UA: NEGATIVE
Glucose, UA: NEGATIVE
KETONES UA: NEGATIVE
Leukocytes, UA: NEGATIVE
Nitrite, UA: NEGATIVE
Protein, UA: NEGATIVE

## 2015-08-20 NOTE — Progress Notes (Signed)
Fetal Surveillance Testing today:  Reactive NST   High Risk Pregnancy Diagnosis(es):   Chronic hypertension  G1P0 1963w0d Estimated Date of Delivery: 09/03/15  Blood pressure 142/80, pulse 82, weight 370 lb 12.8 oz (168.194 kg), last menstrual period 10/18/2014.  Urinalysis: Negative   HPI: The patient is being seen today for ongoing management of chronic hypertension. Today she reports no problems   BP weight and urine results all reviewed and noted. Patient reports good fetal movement, denies any bleeding and no rupture of membranes symptoms or regular contractions.  Fundal Height:   Fetal Heart rate:  150 Edema:  1+  Patient is without complaints other than noted in her HPI. All questions were answered.  All lab and sonogram results have been reviewed. Comments:    Assessment:  1.  Pregnancy at 7463w0d,  Estimated Date of Delivery: 09/03/15 :                          2.  Chronic hypertension                        3.    Medication(s) Plans:  Labetalol 200 mg BID  Treatment Plan:  Induction 08/27/2015 0630  No Follow-up on file. for appointment for high risk OB care  No orders of the defined types were placed in this encounter.   Orders Placed This Encounter  Procedures  . POCT Urinalysis Dipstick

## 2015-08-22 ENCOUNTER — Telehealth (HOSPITAL_COMMUNITY): Payer: Self-pay | Admitting: *Deleted

## 2015-08-22 NOTE — Telephone Encounter (Signed)
Preadmission screen  

## 2015-08-23 ENCOUNTER — Encounter: Payer: Self-pay | Admitting: Obstetrics and Gynecology

## 2015-08-23 ENCOUNTER — Ambulatory Visit (INDEPENDENT_AMBULATORY_CARE_PROVIDER_SITE_OTHER): Payer: Medicaid Other

## 2015-08-23 ENCOUNTER — Ambulatory Visit (INDEPENDENT_AMBULATORY_CARE_PROVIDER_SITE_OTHER): Payer: Medicaid Other | Admitting: Obstetrics and Gynecology

## 2015-08-23 VITALS — BP 134/88 | HR 112 | Wt 370.0 lb

## 2015-08-23 DIAGNOSIS — O10919 Unspecified pre-existing hypertension complicating pregnancy, unspecified trimester: Secondary | ICD-10-CM

## 2015-08-23 DIAGNOSIS — R635 Abnormal weight gain: Secondary | ICD-10-CM

## 2015-08-23 DIAGNOSIS — Z3A39 39 weeks gestation of pregnancy: Secondary | ICD-10-CM | POA: Diagnosis not present

## 2015-08-23 DIAGNOSIS — Z331 Pregnant state, incidental: Secondary | ICD-10-CM

## 2015-08-23 DIAGNOSIS — O10913 Unspecified pre-existing hypertension complicating pregnancy, third trimester: Secondary | ICD-10-CM

## 2015-08-23 DIAGNOSIS — O10912 Unspecified pre-existing hypertension complicating pregnancy, second trimester: Secondary | ICD-10-CM | POA: Diagnosis not present

## 2015-08-23 DIAGNOSIS — O09893 Supervision of other high risk pregnancies, third trimester: Secondary | ICD-10-CM | POA: Diagnosis not present

## 2015-08-23 DIAGNOSIS — Z1389 Encounter for screening for other disorder: Secondary | ICD-10-CM | POA: Diagnosis not present

## 2015-08-23 DIAGNOSIS — Z3A38 38 weeks gestation of pregnancy: Secondary | ICD-10-CM

## 2015-08-23 DIAGNOSIS — O280 Abnormal hematological finding on antenatal screening of mother: Secondary | ICD-10-CM

## 2015-08-23 DIAGNOSIS — Z6841 Body Mass Index (BMI) 40.0 and over, adult: Secondary | ICD-10-CM

## 2015-08-23 NOTE — Progress Notes (Signed)
US 38+3 wks,cephalic,BPP 6/8 (no breathing),post pl gr 2,afi 24cm,fhr 154 bpm,RI .61,.58,gave results to Dr. Emelda FearFerguson.

## 2015-08-23 NOTE — Progress Notes (Signed)
Pt states that she has had diarrhea all day and she feels really bad.

## 2015-08-23 NOTE — Progress Notes (Signed)
Patient ID: Sara Vargas, female   DOB: 03/14/1991, 25 y.o.   MRN: [id number]  High Risk Pregnancy Diagnosis(es): Chronic hypertension  G1P0 828w3d Estimated Date of Delivery: 09/03/15    HPI: The patient is being seen today for ongoing management of High Risk Pregnancy Diagnosis(es): Chronic hypertension. Today, pt complains of diarrhea onset today with associated generalized malaise.  Patient reports good fetal movement, denies any bleeding and no rupture of membranes symptoms or regular contractions.  She has not eaten today due to cramping BP weight and urine results reviewed and noted. Blood pressure 134/88, pulse 112, weight 370 lb (167.831 kg), last menstrual period 10/18/2014.  Fetal Surveillance Testing today: US shows 6/8 BPP will add NST  Fundal Height:  44 Fetal Heart rate:  154 Edema:  neg Urinalysis: Positive for protein 1+   Questions were answered.  Lab and sonogram results have been reviewed. Comments: normal BPP 6/10, beat to beat present but nonreactive. No decels with the contracitons q 4-5  Assessment:  1.  Pregnancy at 618w3d,  Estimated Date of Delivery: 09/03/15 :                          2.  BPP 6/10 will repeat NST in 24hr                         3. Pre-labor prodrome  Medication(s) Plans:  No change  Treatment Plan:  nst in 24  Follow up in 1/7 weeks for appointment for high risk OB care, nst  By signing my name below, I, Sara Vargas, attest that this documentation has been prepared under the direction and in the presence of Christin BachJohn Yazaira Speas, MD. Electronically Signed: Marica OtterNusrat Vargas, ED Scribe. 08/23/2015. 2:34 PM.   I personally performed the services described in this documentation, which was SCRIBED in my presence. The recorded information has been reviewed and considered accurate. It has been edited as necessary during review. Sara Vargas,Sara Galeno V, MD

## 2015-08-24 ENCOUNTER — Ambulatory Visit (INDEPENDENT_AMBULATORY_CARE_PROVIDER_SITE_OTHER): Payer: Medicaid Other | Admitting: Obstetrics and Gynecology

## 2015-08-24 VITALS — BP 136/90 | HR 98 | Wt 370.0 lb

## 2015-08-24 DIAGNOSIS — Z3A39 39 weeks gestation of pregnancy: Secondary | ICD-10-CM | POA: Diagnosis not present

## 2015-08-24 DIAGNOSIS — Z331 Pregnant state, incidental: Secondary | ICD-10-CM

## 2015-08-24 DIAGNOSIS — Z1389 Encounter for screening for other disorder: Secondary | ICD-10-CM

## 2015-08-24 DIAGNOSIS — O288 Other abnormal findings on antenatal screening of mother: Secondary | ICD-10-CM

## 2015-08-24 DIAGNOSIS — O289 Unspecified abnormal findings on antenatal screening of mother: Secondary | ICD-10-CM | POA: Diagnosis not present

## 2015-08-24 DIAGNOSIS — O10912 Unspecified pre-existing hypertension complicating pregnancy, second trimester: Secondary | ICD-10-CM | POA: Diagnosis not present

## 2015-08-24 DIAGNOSIS — O09893 Supervision of other high risk pregnancies, third trimester: Secondary | ICD-10-CM

## 2015-08-24 DIAGNOSIS — O10919 Unspecified pre-existing hypertension complicating pregnancy, unspecified trimester: Secondary | ICD-10-CM

## 2015-08-24 LAB — POCT URINALYSIS DIPSTICK
Blood, UA: NEGATIVE
Glucose, UA: NEGATIVE
Ketones, UA: NEGATIVE
LEUKOCYTES UA: NEGATIVE
NITRITE UA: NEGATIVE

## 2015-08-24 NOTE — Progress Notes (Signed)
Pt here today for follow up NST.

## 2015-08-24 NOTE — Progress Notes (Signed)
NST reactive. Contractions have resolved. Yesterday's BPP was 6/10, and now NST is fine. Pt has tolerated diet today. Pt is planned for [facility] 39.[facility] wk [facility]  At 6:30 am. No options for PM induction time. Pt is likely a prolonged induction , with several cytotec to be necessary to ripen cerix. Pt is aware of the unfavorable cervix, and has been advised to expect a prolonged IOL effort to be required.

## 2015-08-27 ENCOUNTER — Encounter (HOSPITAL_COMMUNITY): Payer: Self-pay

## 2015-08-27 ENCOUNTER — Inpatient Hospital Stay (HOSPITAL_COMMUNITY)
Admission: RE | Admit: 2015-08-27 | Discharge: 2015-08-31 | DRG: 765 | Disposition: A | Discharge status: Home / Self Care | Payer: Medicaid Other | Source: Ambulatory Visit | Attending: Obstetrics & Gynecology | Admitting: Obstetrics & Gynecology

## 2015-08-27 VITALS — BP 135/64 | HR 92 | Temp 98.5°F | Resp 20 | Ht 65.0 in | Wt 370.0 lb

## 2015-08-27 DIAGNOSIS — Z87891 Personal history of nicotine dependence: Secondary | ICD-10-CM | POA: Diagnosis not present

## 2015-08-27 DIAGNOSIS — Z825 Family history of asthma and other chronic lower respiratory diseases: Secondary | ICD-10-CM | POA: Diagnosis not present

## 2015-08-27 DIAGNOSIS — O285 Abnormal chromosomal and genetic finding on antenatal screening of mother: Secondary | ICD-10-CM | POA: Diagnosis present

## 2015-08-27 DIAGNOSIS — O324XX0 Maternal care for high head at term, not applicable or unspecified: Secondary | ICD-10-CM | POA: Diagnosis present

## 2015-08-27 DIAGNOSIS — Z8249 Family history of ischemic heart disease and other diseases of the circulatory system: Secondary | ICD-10-CM | POA: Diagnosis not present

## 2015-08-27 DIAGNOSIS — O09893 Supervision of other high risk pregnancies, third trimester: Secondary | ICD-10-CM

## 2015-08-27 DIAGNOSIS — O280 Abnormal hematological finding on antenatal screening of mother: Secondary | ICD-10-CM | POA: Diagnosis present

## 2015-08-27 DIAGNOSIS — Z6841 Body Mass Index (BMI) 40.0 and over, adult: Secondary | ICD-10-CM

## 2015-08-27 DIAGNOSIS — O1002 Pre-existing essential hypertension complicating childbirth: Secondary | ICD-10-CM | POA: Diagnosis not present

## 2015-08-27 DIAGNOSIS — O99344 Other mental disorders complicating childbirth: Secondary | ICD-10-CM | POA: Diagnosis present

## 2015-08-27 DIAGNOSIS — O99214 Obesity complicating childbirth: Secondary | ICD-10-CM | POA: Diagnosis present

## 2015-08-27 DIAGNOSIS — O99824 Streptococcus B carrier state complicating childbirth: Secondary | ICD-10-CM | POA: Diagnosis present

## 2015-08-27 DIAGNOSIS — O10919 Unspecified pre-existing hypertension complicating pregnancy, unspecified trimester: Secondary | ICD-10-CM | POA: Diagnosis present

## 2015-08-27 DIAGNOSIS — F418 Other specified anxiety disorders: Secondary | ICD-10-CM | POA: Diagnosis present

## 2015-08-27 DIAGNOSIS — Z3A39 39 weeks gestation of pregnancy: Secondary | ICD-10-CM | POA: Diagnosis not present

## 2015-08-27 DIAGNOSIS — O09899 Supervision of other high risk pregnancies, unspecified trimester: Secondary | ICD-10-CM

## 2015-08-27 DIAGNOSIS — Z98891 History of uterine scar from previous surgery: Secondary | ICD-10-CM

## 2015-08-27 LAB — CBC
HEMATOCRIT: 36.4 % (ref 36.0–46.0)
HEMOGLOBIN: 12.5 g/dL (ref 12.0–15.0)
MCH: 29.1 pg (ref 26.0–34.0)
MCHC: 34.3 g/dL (ref 30.0–36.0)
MCV: 84.8 fL (ref 78.0–100.0)
Platelets: 267 10*3/uL (ref 150–400)
RBC: 4.29 MIL/uL (ref 3.87–5.11)
RDW: 13.4 % (ref 11.5–15.5)
WBC: 10.4 10*3/uL (ref 4.0–10.5)

## 2015-08-27 LAB — TYPE AND SCREEN
ABO/RH(D): A NEG
Antibody Screen: NEGATIVE

## 2015-08-27 LAB — ABO/RH: ABO/RH(D): A NEG

## 2015-08-27 MED ORDER — LACTATED RINGERS IV SOLN
INTRAVENOUS | Status: DC
  Administered 2015-08-27 – 2015-08-28 (×4): via INTRAVENOUS

## 2015-08-27 MED ORDER — ACETAMINOPHEN 325 MG PO TABS: 650.0000 mg | ORAL_TABLET | ORAL | Status: DC | PRN

## 2015-08-27 MED ORDER — OXYTOCIN BOLUS FROM INFUSION: 500.0000 mL | INTRAVENOUS | Status: DC

## 2015-08-27 MED ORDER — FLEET ENEMA 7-19 GM/118ML RE ENEM: 1.0000 | ENEMA | Freq: Every day | RECTAL | Status: DC | PRN

## 2015-08-27 MED ORDER — CITRIC ACID-SODIUM CITRATE 334-500 MG/5ML PO SOLN
30.0000 mL | ORAL | Status: DC | PRN
  Administered 2015-08-27: 30 mL via ORAL
  Filled 2015-08-27: qty 15

## 2015-08-27 MED ORDER — HYDROXYZINE HCL 25 MG PO TABS
25.0000 mg | ORAL_TABLET | Freq: Three times a day (TID) | ORAL | Status: DC | PRN
  Administered 2015-08-27 – 2015-08-28 (×2): 25 mg via ORAL
  Filled 2015-08-27 (×3): qty 1

## 2015-08-27 MED ORDER — LACTATED RINGERS IV SOLN
500.0000 mL | INTRAVENOUS | Status: DC | PRN
  Administered 2015-08-28: 1000 mL via INTRAVENOUS

## 2015-08-27 MED ORDER — OXYTOCIN 10 UNIT/ML IJ SOLN
1.0000 m[IU]/min | INTRAVENOUS | Status: DC
  Administered 2015-08-27: 2 m[IU]/min via INTRAVENOUS

## 2015-08-27 MED ORDER — LABETALOL HCL 200 MG PO TABS
200.0000 mg | ORAL_TABLET | Freq: Two times a day (BID) | ORAL | Status: DC
  Administered 2015-08-27: 200 mg via ORAL
  Filled 2015-08-27 (×2): qty 1

## 2015-08-27 MED ORDER — FENTANYL CITRATE (PF) 100 MCG/2ML IJ SOLN
100.0000 ug | INTRAMUSCULAR | Status: DC | PRN
  Administered 2015-08-27 – 2015-08-28 (×2): 100 ug via INTRAVENOUS
  Filled 2015-08-27 (×2): qty 2

## 2015-08-27 MED ORDER — MISOPROSTOL 25 MCG QUARTER TABLET
25.0000 ug | ORAL_TABLET | ORAL | Status: DC | PRN
  Administered 2015-08-27 (×3): 25 ug via VAGINAL
  Filled 2015-08-27 (×3): qty 0.25

## 2015-08-27 MED ORDER — ONDANSETRON HCL 4 MG/2ML IJ SOLN: 4.0000 mg | Freq: Four times a day (QID) | INTRAMUSCULAR | Status: DC | PRN

## 2015-08-27 MED ORDER — TERBUTALINE SULFATE 1 MG/ML IJ SOLN: 0.2500 mg | Freq: Once | INTRAMUSCULAR | Status: DC | PRN

## 2015-08-27 MED ORDER — LIDOCAINE HCL (PF) 1 % IJ SOLN
30.0000 mL | INTRAMUSCULAR | Status: DC | PRN
  Filled 2015-08-27: qty 30

## 2015-08-27 MED ORDER — PENICILLIN G POTASSIUM 5000000 UNITS IJ SOLR
2.5000 10*6.[IU] | INTRAMUSCULAR | Status: DC
  Administered 2015-08-27 – 2015-08-28 (×9): 2.5 10*6.[IU] via INTRAVENOUS
  Filled 2015-08-27 (×13): qty 2.5

## 2015-08-27 MED ORDER — OXYCODONE-ACETAMINOPHEN 5-325 MG PO TABS: 1.0000 | ORAL_TABLET | ORAL | Status: DC | PRN

## 2015-08-27 MED ORDER — DEXTROSE 5 % IV SOLN
5.0000 10*6.[IU] | Freq: Once | INTRAVENOUS | Status: AC
  Administered 2015-08-27: 5 10*6.[IU] via INTRAVENOUS
  Filled 2015-08-27: qty 5

## 2015-08-27 MED ORDER — LACTATED RINGERS IV SOLN
2.5000 [IU]/h | INTRAVENOUS | Status: DC
  Filled 2015-08-27: qty 4

## 2015-08-27 MED ORDER — OXYCODONE-ACETAMINOPHEN 5-325 MG PO TABS
2.0000 | ORAL_TABLET | ORAL | Status: DC | PRN
  Administered 2015-08-27: 2 via ORAL
  Filled 2015-08-27: qty 2

## 2015-08-27 NOTE — H&P (Signed)
LABOR ADMISSION HISTORY AND PHYSICAL  Sara Vargas is a 25 y.o. female G1P0 with IUP at [redacted]w[redacted]d by 6wk US presenting for IOL.  She has a history of chronic hypertension.  She reports +FMs.  Denies LOF, VB, blurry vision, headaches, peripheral edema, RUQ pain.  She plans on breast feeding. She request POP/Nexplanon for birth control.  [facility]   Dating: By 6wk US --->  Estimated Date of Delivery: 09/03/15  Sono:    @[redacted]w[redacted]d , normal anatomy, posterior placenta, cephalic presentation, 3197g, 16%56% EFW   Prenatal History/Complications:  [facility]  Initiated Care at  7 weeks  FOB Richard caudle  Dating By 6 week US  Pap   GC/CT Initial:   -/-             36+wks:   -/-  Genetic Screen NT/IT: +OSB 1:150  CF screen neg  Anatomic US Female, detailed u/s w/ MFM, all normal except limited view of ductal arch  Flu vaccine Declined 02/28/15  Tdap Recommended ~ 28wks  Glucose Screen  Early 2 hr normal, Repeat @ 28wks:  GBS Pos  Feed Preference breast  Contraception POPs  Circumcision n/a  Childbirth Classes declines  Pediatrician Undecided, info given    Past Medical History: Past Medical History  Diagnosis Date  . Hypertension     labetolol 200 bid  . Anxiety   . Depression     Past Surgical History: Past Surgical History  Procedure Laterality Date  . Tonsillectomy    . Arm surgery    . Ureteral exploration      Obstetrical History: OB History    Gravida Para Term Preterm AB TAB SAB Ectopic Multiple Living   1               Social History: Social History   Social History  . Marital Status: Single    Spouse Name: N/A  . Number of Children: N/A  . Years of Education: N/A   Social History Main Topics  . Smoking status: Former Smoker -- 6.00 packs/day    Types: Cigarettes    Quit date: 03/09/2015  . Smokeless tobacco: Never Used  . Alcohol Use: No  . Drug Use: No  . Sexual Activity: Yes     Birth Control/ Protection: None   Other Topics Concern  . None   Social History Narrative    Family History: Family History  Problem Relation Age of Onset  . Heart disease Mother   . COPD Mother   . Asthma Mother   . Other Father     benign brain tumor  . Heart disease Maternal Grandmother     Allergies: No Known Allergies  Prescriptions prior to admission  Medication Sig Dispense Refill Last Dose  . acetaminophen (TYLENOL) 500 MG tablet Take 1,000 mg by mouth every 6 (six) hours as needed for moderate pain.   08/26/2015 at Unknown time  . calcium carbonate (TUMS - DOSED IN MG ELEMENTAL CALCIUM) 500 MG chewable tablet Chew 2-3 tablets by mouth 2 (two) times daily as needed for indigestion or heartburn.   08/26/2015 at Unknown time  . labetalol (NORMODYNE) 200 MG tablet Take 1 tablet (200 mg total) by mouth 2 (two) times daily. 60 tablet 3 08/27/2015 at 0530  . Prenatal Vit-Fe Fumarate-FA (MULTIVITAMIN-PRENATAL) 27-0.8 MG TABS tablet Take 1 tablet by mouth daily at 12 noon. 30 each 11 08/26/2015 at Unknown time     Review  of Systems   All systems reviewed and negative except as stated in HPI  Blood pressure 134/62, pulse 84, temperature 98.5 F (36.9 C), temperature source Oral, resp. rate 18, height  (1.651 m), weight 370 lb (167.831 kg), last menstrual period 10/18/2014. General appearance: alert, cooperative, appears stated age, no distress and morbidly obese Lungs: clear to auscultation bilaterally, no increased WOB Heart: regular rate and rhythm, no m/r/g Abdomen: soft, non-tender; bowel sounds normal Pelvic: normal external; Dilation: Closed Effacement (%): Thick Cervical Position: Posterior Station: -3 Presentation: Vertex Exam by:: Dr Nadine Counts  Extremities: WWP, Homans sign is negative, no sign of DVT, +2 DP Neuro: patellar DTRs technically difficult to elicit  Presentation: cephalic Fetal monitoringBaseline: 135 bpm, Variability: Fair (1-6 bpm) and  Accelerations: Reactive Uterine activityNone   Prenatal labs: ABO, Rh: A/Negative/-- (08/25 1221) Antibody: Negative (01/18 1038) Rubella: !Error! RPR: Non Reactive (01/18 1038)  HBsAg: Negative (08/25 1221)  HIV: Non Reactive (01/18 1038)  GBS: Positive (03/13 1500)  2 hr Glucola normal Genetic screening   +OSB 1:150 Anatomy US Female, detailed u/s w/ MFM, all normal except limited view of ductal arch  Prenatal Transfer Tool  Maternal Diabetes: No Genetic Screening: Abnormal:  Results: Other:elevated risk of spina bifida Maternal Ultrasounds/Referrals: Normal Fetal Ultrasounds or other Referrals:  None Maternal Substance Abuse:  No Significant Maternal Medications:  Meds include: Other: Labetalol Significant Maternal Lab Results: Lab values include: Group B Strep positive  Results for orders placed or performed during the hospital encounter of 08/27/15 (from the past 24 hour(s))  CBC   Collection Time: 08/27/15  7:30 AM  Result Value Ref Range   WBC 10.4 4.0 - 10.5 K/uL   RBC 4.29 3.87 - 5.11 MIL/uL   Hemoglobin 12.5 12.0 - 15.0 g/dL   HCT 78.2 95.6 - 21.3 %   MCV 84.8 78.0 - 100.0 fL   MCH 29.1 26.0 - 34.0 pg   MCHC 34.3 30.0 - 36.0 g/dL   RDW 08.6 57.8 - 46.9 %   Platelets 267 150 - 400 K/uL    Patient Active Problem List   Diagnosis Date Noted  . Indication for care in labor or delivery 08/27/2015  . Abnormal MSAFP (maternal serum alpha-fetoprotein), elevated 04/12/2015  . Abnormal genetic test during pregnancy 04/02/2015  . Supervision of other high-risk pregnancy 01/18/2015  . Morbid obesity with BMI of 50.0-59.9, adult (HCC) 01/18/2015  . Chronic hypertension during pregnancy, antepartum   . ABNORMAL WEIGHT GAIN 04/13/2007    Assessment: Sara Vargas is a 25 y.o. G1P0 at [redacted]w[redacted]d here for IOL for chronic hypertension.  She is GBS+ and baby had a genetic screening that showed an increased risk of spina bifida.  She has taken her am dose of  Labetalol.  #Chronic HTN: Home dose Labetalol to start @ 2200.  #Labor: IOL, cytotec  per vagina ordered.  Defer Pitocin order to day team #Pain: Plans for epidural #FWB: Tracing poor quality but seemingly Cat 2 for <6 variability #ID:  GBS +, PCN ordered #MOF: Breast #MOC:POP/Nexplanon #Circ:  n/a  Delynn Flavin, DO PGY-2, [facility] 08/27/2015, 8:35 AM   OB FELLOW HISTORY AND PHYSICAL ATTESTATION  I have seen and examined this patient; I agree with above documentation in the resident's note.    Cherrie Gauze Wouk 08/27/2015, 12:18 PM

## 2015-08-27 NOTE — Progress Notes (Signed)
LABOR PROGRESS NOTE  Sara Vargas is a 25 y.o. G1P0 at 4419w0d  admitted for IOL for cHTN  Subjective: Patient reports that she is doing well.  Percocet relieved low back pain well.  She notes that FB just fell out.    Objective: BP 126/60 mmHg  Pulse 75  Temp(Src) 97.7 F (36.5 C) (Oral)  Resp 18  Ht 5\' 5"  (1.651 m)  Wt 370 lb (167.831 kg)  BMI 61.57 kg/m2  SpO2 100%  LMP 10/18/2014 (Approximate) or  Filed Vitals:   08/27/15 1626 08/27/15 1813 08/27/15 2015 08/27/15 2052  BP: 117/70 138/97  126/60  Pulse: 85 78  75  Temp: 98.1 F (36.7 C)  97.7 F (36.5 C)   TempSrc: Oral  Oral   Resp: 20 20 18    Height:      Weight:      SpO2:        Fetal monitoringBaseline: 155 bpm, Variability: Good {> 6 bpm), Accelerations: Reactive and Decelerations: Absent Uterine activityFrequency: Every 5-6 minutes   Dilation: 4 Effacement (%): 70 Cervical Position: Anterior Station: -3, Ballotable Presentation: Vertex Exam by:: Rogelia Rohrerebecca McCall RN   Labs: Lab Results  Component Value Date   WBC 10.4 08/27/2015   HGB 12.5 08/27/2015   HCT 36.4 08/27/2015   MCV 84.8 08/27/2015   PLT 267 08/27/2015    Patient Active Problem List   Diagnosis Date Noted  . Indication for care in labor or delivery 08/27/2015  . Abnormal MSAFP (maternal serum alpha-fetoprotein), elevated 04/12/2015  . Abnormal genetic test during pregnancy 04/02/2015  . Supervision of other high-risk pregnancy 01/18/2015  . Morbid obesity with BMI of 50.0-59.9, adult (HCC) 01/18/2015  . Chronic hypertension during pregnancy, antepartum   . ABNORMAL WEIGHT GAIN 04/13/2007    Assessment / Plan: 25 y.o. G1P0 at 3719w0d here for IOL for chronic HTN  Labor: progressing well, FB out, Start pitocin Fetal Wellbeing:  Cat 1 Pain Control:  Percocet, planning for epidural Anticipated MOD:  SVD  Delynn FlavinAshly Jazmeen Axtell, DO PGY-2, [facility] 08/27/2015, 9:55 PM

## 2015-08-27 NOTE — Progress Notes (Signed)
Sara Vargas is a 25 y.o. G1P0 at 3654w0d by ultrasound admitted for induction of labor due to Hypertension.  Subjective: Pt is endorsing severe episodic abdominal and lower back pain.   Objective: BP 117/70 mmHg  Pulse 85  Temp(Src) 98.1 F (36.7 C) (Oral)  Resp 20  Ht 5\' 5"  (1.651 m)  Wt 370 lb (167.831 kg)  BMI 61.57 kg/m2  SpO2 100%  LMP 10/18/2014 (Approximate)     FHT:  FHR: 150 bpm, variability: average,  accelerations:  Absent,  decelerations:  Absent UC:   irregular, every 20 minutes SVE:   Dilation: Fingertip Effacement (%): Thick Station: -3 Exam by:: Sara RainwaterPAmela Lawson RN   Labs: Lab Results  Component Value Date   WBC 10.4 08/27/2015   HGB 12.5 08/27/2015   HCT 36.4 08/27/2015   MCV 84.8 08/27/2015   PLT 267 08/27/2015    Assessment / Plan: Induction of labor due to chronic hypertension,  progressing slowly on pitocin  Labor: Progressing slowly Pitocin, will continue to increase then AROM Preeclampsia:  no signs or symptoms of toxicity Fetal Wellbeing:  Category I Pain Control:  Percocet for now, with plans for epidural I/D:  GBS+, on PCN Anticipated MOD:  NSVD  Hilton SinclairKaty D Mayo 08/27/2015, 5:21 PM

## 2015-08-27 NOTE — Consults (Signed)
  Anesthesia Pain Consult Note  Patient: Sara Vargas, 25 y.o., female  Consult Requested by: Levie HeritageJacob J Stinson, DO  Reason for Consult: CRNA Pain round  Level of Consciousness: alert  Pain: 0 /10 Pain Goal: 5  Last Vitals:  Filed Vitals:   08/27/15 0700 08/27/15 0704  BP: 134/62   Pulse: 84 84  Temp: 36.9 C 36.9 C  Resp: 18 18    Plan: Epidural infusion for pain control.   Paisyn Guercio 08/27/2015

## 2015-08-28 ENCOUNTER — Encounter (HOSPITAL_COMMUNITY): Payer: Self-pay

## 2015-08-28 ENCOUNTER — Inpatient Hospital Stay (HOSPITAL_COMMUNITY): Payer: Medicaid Other | Admitting: Anesthesiology

## 2015-08-28 ENCOUNTER — Encounter (HOSPITAL_COMMUNITY)
Admission: RE | Disposition: A | Discharge status: Home / Self Care | Payer: Self-pay | Source: Ambulatory Visit | Attending: Obstetrics & Gynecology

## 2015-08-28 LAB — CBC
HEMATOCRIT: 35.9 % — AB (ref 36.0–46.0)
Hemoglobin: 12.3 g/dL (ref 12.0–15.0)
MCH: 29 pg (ref 26.0–34.0)
MCHC: 34.3 g/dL (ref 30.0–36.0)
MCV: 84.7 fL (ref 78.0–100.0)
PLATELETS: 303 10*3/uL (ref 150–400)
RBC: 4.24 MIL/uL (ref 3.87–5.11)
RDW: 13.7 % (ref 11.5–15.5)
WBC: 14.8 10*3/uL — AB (ref 4.0–10.5)

## 2015-08-28 LAB — RPR: RPR: NONREACTIVE

## 2015-08-28 SURGERY — Surgical Case
Anesthesia: Epidural

## 2015-08-28 MED ORDER — MORPHINE SULFATE (PF) 0.5 MG/ML IJ SOLN
INTRAMUSCULAR | Status: DC | PRN
  Administered 2015-08-28: 4 mg via EPIDURAL

## 2015-08-28 MED ORDER — LACTATED RINGERS IV SOLN
INTRAVENOUS | Status: DC | PRN
  Administered 2015-08-28: 23:00:00 via INTRAVENOUS

## 2015-08-28 MED ORDER — DIPHENHYDRAMINE HCL 50 MG/ML IJ SOLN
12.5000 mg | INTRAMUSCULAR | Status: DC | PRN
  Administered 2015-08-28: 12.5 mg via INTRAVENOUS
  Filled 2015-08-28: qty 1

## 2015-08-28 MED ORDER — SODIUM CHLORIDE 0.9 % IR SOLN
Status: DC | PRN
  Administered 2015-08-28: 1000 mL

## 2015-08-28 MED ORDER — SCOPOLAMINE 1 MG/3DAYS TD PT72
MEDICATED_PATCH | TRANSDERMAL | Status: AC
  Filled 2015-08-28: qty 1

## 2015-08-28 MED ORDER — SODIUM CHLORIDE 0.9 % IV SOLN
10000.0000 ug | INTRAVENOUS | Status: DC | PRN
  Administered 2015-08-28: 40 ug via INTRAVENOUS
  Administered 2015-08-28 (×2): 80 ug via INTRAVENOUS

## 2015-08-28 MED ORDER — EPHEDRINE 5 MG/ML INJ: 10.0000 mg | INTRAVENOUS | Status: DC | PRN

## 2015-08-28 MED ORDER — FENTANYL CITRATE (PF) 100 MCG/2ML IJ SOLN
INTRAMUSCULAR | Status: AC
  Filled 2015-08-28: qty 2

## 2015-08-28 MED ORDER — PHENYLEPHRINE 40 MCG/ML (10ML) SYRINGE FOR IV PUSH (FOR BLOOD PRESSURE SUPPORT)
80.0000 ug | PREFILLED_SYRINGE | INTRAVENOUS | Status: DC | PRN
  Filled 2015-08-28: qty 20

## 2015-08-28 MED ORDER — PHENYLEPHRINE 40 MCG/ML (10ML) SYRINGE FOR IV PUSH (FOR BLOOD PRESSURE SUPPORT): 80.0000 ug | PREFILLED_SYRINGE | INTRAVENOUS | Status: DC | PRN

## 2015-08-28 MED ORDER — OXYTOCIN 10 UNIT/ML IJ SOLN
INTRAMUSCULAR | Status: AC
  Filled 2015-08-28: qty 4

## 2015-08-28 MED ORDER — ONDANSETRON HCL 4 MG/2ML IJ SOLN
INTRAMUSCULAR | Status: DC | PRN
  Administered 2015-08-28: 4 mg via INTRAVENOUS

## 2015-08-28 MED ORDER — CITRIC ACID-SODIUM CITRATE 334-500 MG/5ML PO SOLN
ORAL | Status: AC
  Administered 2015-08-28: 30 mL
  Filled 2015-08-28: qty 15

## 2015-08-28 MED ORDER — ONDANSETRON HCL 4 MG/2ML IJ SOLN
INTRAMUSCULAR | Status: AC
Start: 2015-08-28 — End: 2015-08-28
  Filled 2015-08-28: qty 2

## 2015-08-28 MED ORDER — LIDOCAINE HCL (PF) 1 % IJ SOLN
INTRAMUSCULAR | Status: DC | PRN
  Administered 2015-08-28 (×2): 8 mL via EPIDURAL

## 2015-08-28 MED ORDER — LABETALOL HCL 200 MG PO TABS
200.0000 mg | ORAL_TABLET | Freq: Two times a day (BID) | ORAL | Status: DC
Start: 2015-08-28 — End: 2015-08-29
  Administered 2015-08-28: 200 mg via ORAL
  Filled 2015-08-28 (×2): qty 1

## 2015-08-28 MED ORDER — SCOPOLAMINE 1 MG/3DAYS TD PT72
MEDICATED_PATCH | TRANSDERMAL | Status: DC | PRN
  Administered 2015-08-28: 1 via TRANSDERMAL

## 2015-08-28 MED ORDER — DEXAMETHASONE SODIUM PHOSPHATE 4 MG/ML IJ SOLN
INTRAMUSCULAR | Status: AC
  Filled 2015-08-28: qty 1

## 2015-08-28 MED ORDER — SODIUM BICARBONATE 8.4 % IV SOLN
INTRAVENOUS | Status: DC | PRN
  Administered 2015-08-28 (×3): 5 mL via EPIDURAL

## 2015-08-28 MED ORDER — LACTATED RINGERS IV SOLN: 500.0000 mL | Freq: Once | INTRAVENOUS | Status: DC

## 2015-08-28 MED ORDER — DEXAMETHASONE SODIUM PHOSPHATE 4 MG/ML IJ SOLN
INTRAMUSCULAR | Status: DC | PRN
  Administered 2015-08-28: 4 mg via INTRAVENOUS

## 2015-08-28 MED ORDER — FENTANYL 2.5 MCG/ML BUPIVACAINE 1/10 % EPIDURAL INFUSION (WH - ANES)
14.0000 mL/h | INTRAMUSCULAR | Status: DC | PRN
  Administered 2015-08-28 (×3): 14 mL/h via EPIDURAL
  Filled 2015-08-28 (×3): qty 125

## 2015-08-28 MED ORDER — MORPHINE SULFATE (PF) 0.5 MG/ML IJ SOLN
INTRAMUSCULAR | Status: AC
  Filled 2015-08-28: qty 10

## 2015-08-28 MED ORDER — BUPIVACAINE HCL (PF) 0.5 % IJ SOLN
INTRAMUSCULAR | Status: AC
  Filled 2015-08-28: qty 30

## 2015-08-28 MED ORDER — OXYTOCIN 10 UNIT/ML IJ SOLN
40.0000 [IU] | INTRAVENOUS | Status: DC | PRN
  Administered 2015-08-28: 40 [IU] via INTRAVENOUS

## 2015-08-28 MED ORDER — DEXTROSE 5 % IV SOLN
3.0000 g | INTRAVENOUS | Status: DC | PRN
  Administered 2015-08-28: 3 g via INTRAVENOUS

## 2015-08-28 MED ORDER — FENTANYL CITRATE (PF) 100 MCG/2ML IJ SOLN
INTRAMUSCULAR | Status: DC | PRN
  Administered 2015-08-28: 100 ug via EPIDURAL
  Administered 2015-08-29: 50 ug via INTRAVENOUS

## 2015-08-28 SURGICAL SUPPLY — 33 items
CHLORAPREP W/TINT 26ML (MISCELLANEOUS) ×3 IMPLANT
CLAMP CORD UMBIL (MISCELLANEOUS) IMPLANT
CLOTH BEACON ORANGE TIMEOUT ST (SAFETY) ×3 IMPLANT
DRESSING DISP NPWT PICO 4X12 (MISCELLANEOUS) ×2 IMPLANT
DRSG OPSITE POSTOP 4X10 (GAUZE/BANDAGES/DRESSINGS) ×3 IMPLANT
ELECT REM PT RETURN 9FT ADLT (ELECTROSURGICAL) ×3
ELECTRODE REM PT RTRN 9FT ADLT (ELECTROSURGICAL) ×1 IMPLANT
EXTRACTOR VACUUM M CUP 4 TUBE (SUCTIONS) IMPLANT
EXTRACTOR VACUUM M CUP 4' TUBE (SUCTIONS)
GLOVE BIOGEL PI IND STRL 7.0 (GLOVE) ×3 IMPLANT
GLOVE BIOGEL PI INDICATOR 7.0 (GLOVE) ×6
GLOVE ECLIPSE 7.0 STRL STRAW (GLOVE) ×3 IMPLANT
GOWN STRL REUS W/TWL LRG LVL3 (GOWN DISPOSABLE) ×6 IMPLANT
HEMOSTAT SURGICEL 2X14 (HEMOSTASIS) ×2 IMPLANT
KIT ABG SYR 3ML LUER SLIP (SYRINGE) IMPLANT
NDL HYPO 25X5/8 SAFETYGLIDE (NEEDLE) ×1 IMPLANT
NEEDLE HYPO 22GX1.5 SAFETY (NEEDLE) ×3 IMPLANT
NEEDLE HYPO 25X5/8 SAFETYGLIDE (NEEDLE) ×3 IMPLANT
NS IRRIG 1000ML POUR BTL (IV SOLUTION) ×5 IMPLANT
PACK C SECTION WH (CUSTOM PROCEDURE TRAY) ×3 IMPLANT
PAD ABD 7.5X8 STRL (GAUZE/BANDAGES/DRESSINGS) ×3 IMPLANT
PAD OB MATERNITY 4.3X12.25 (PERSONAL CARE ITEMS) ×3 IMPLANT
PENCIL SMOKE EVAC W/HOLSTER (ELECTROSURGICAL) ×3 IMPLANT
RTRCTR C-SECT PINK 25CM LRG (MISCELLANEOUS) IMPLANT
SPONGE LAP 18X18 X RAY DECT (DISPOSABLE) ×6 IMPLANT
SUT PDS AB 0 CTX 36 PDP370T (SUTURE) ×3 IMPLANT
SUT PLAIN 2 0 XLH (SUTURE) IMPLANT
SUT VIC AB 0 CTX 36 (SUTURE) ×12
SUT VIC AB 0 CTX36XBRD ANBCTRL (SUTURE) ×3 IMPLANT
SUT VIC AB 4-0 KS 27 (SUTURE) ×3 IMPLANT
SYR CONTROL 10ML LL (SYRINGE) ×3 IMPLANT
TOWEL OR 17X24 6PK STRL BLUE (TOWEL DISPOSABLE) ×3 IMPLANT
TRAY FOLEY CATH SILVER 14FR (SET/KITS/TRAYS/PACK) ×3 IMPLANT

## 2015-08-28 NOTE — Consult Note (Signed)
[facility]  Delivery Note:  C-section       08/28/2015  11:42 PM  I was called to the operating room at the request of the patient's obstetrician (Dr. Graylin ShiverAnyanw) for a primary c-section.  PRENATAL HX:  This is a 25 y/o G1P0 at 6939 and 1/[redacted] weeks gestation who was admitted on 4/5 for IOL for chronic hypertension.  ROM x25 hours.  C-section for failed vacuum and failure to descend.  GBS positive but received adequate treatment.    DELIVERY:  Infant was vigorous at delivery, requiring no resuscitation other than standard warming, drying and stimulation.  APGARs 8 and 9.  Exam notable for caput and molding, otherwise within normal limits.  After 5 minutes, baby left with nurse to assist parents with skin-to-skin care.   _____________________ Electronically Signed By: Maryan CharLindsey Samary Shatz, MD Neonatologist

## 2015-08-28 NOTE — Progress Notes (Signed)
LABOR PROGRESS NOTE  Sara Vargas is a 25 y.o. G1P0 at 228w0d  admitted for IOL for cHTN  Subjective: Patient reports that she is becoming increasingly uncomfortable, esp in her low back.  Asking for pain medication.  Not yet ready for epidural.  Objective: BP 146/58 mmHg  Pulse 86  Temp(Src) 98.1 F (36.7 C) (Oral)  Resp 18  Ht 5\' 5"  (1.651 m)  Wt 370 lb (167.831 kg)  BMI 61.57 kg/m2  SpO2 100%  LMP 10/18/2014 (Approximate) or  Filed Vitals:   08/27/15 2230 08/27/15 2300 08/27/15 2341 08/27/15 2342  BP: 128/81   146/58  Pulse: 84  86 86  Temp:  98.1 F (36.7 C)    TempSrc:  Oral    Resp: 18     Height:      Weight:      SpO2:   100%    Gen: awake, alert, uncomfortable Neuro: DTRs difficult to elicit 2/2 habitus, no clonus.  Fetal monitoringBaseline: 145 bpm, Variability: Fair (1-6 bpm), Accelerations: Reactive and Decelerations: Absent Uterine activity Frequency: Every 2-3 minutes   Dilation: 4 Effacement (%): 70 Cervical Position: Anterior Station: -3, Ballotable Presentation: Vertex Exam by:: Rogelia Rohrerebecca McCall RN   Labs: Lab Results  Component Value Date   WBC 10.4 08/27/2015   HGB 12.5 08/27/2015   HCT 36.4 08/27/2015   MCV 84.8 08/27/2015   PLT 267 08/27/2015    Patient Active Problem List   Diagnosis Date Noted  . Indication for care in labor or delivery 08/27/2015  . Abnormal MSAFP (maternal serum alpha-fetoprotein), elevated 04/12/2015  . Abnormal genetic test during pregnancy 04/02/2015  . Supervision of other high-risk pregnancy 01/18/2015  . Morbid obesity with BMI of 50.0-59.9, adult (HCC) 01/18/2015  . Chronic hypertension during pregnancy, antepartum   . ABNORMAL WEIGHT GAIN 04/13/2007    Assessment / Plan: [age] y.o. G1P0 at 4828w0d here for IOL for chronic HTN.  SROM @ 2200.  Cervix unchanged at that time.  Fetal scalp electrode placed around 2230.  Does not wish for epidural yet.  IV Fentanyl given.  She appears uncomfortable.  Suspect that  she will want epidural soon.  Labor: on Pitocin, SROM Fetal Wellbeing:  Cat 1 Pain Control:  Fentanyl IV given, planning for epidural Anticipated MOD:  SVD  Delynn FlavinAshly Mackinley Kiehn, DO PGY-2, [facility] 08/28/2015, 12:02 AM

## 2015-08-28 NOTE — Progress Notes (Signed)
Sara BuddsHannah G Antilla is a 25 y.o. G1P0 at 3638w1d by ultrasound admitted for induction of labor due to Hypertension.  Subjective: Patient has been fully dilated for several hours but has been pushing for about a couple of hours with no descent from +2 station. She is tired and desires help.    Objective: BP 128/61 mmHg  Pulse 103  Temp(Src) 98.4 F (36.9 C) (Oral)  Resp 18  Ht 5\' 5"  (1.651 m)  Wt 370 lb (167.831 kg)  BMI 61.57 kg/m2  SpO2 100%  LMP 10/18/2014 (Approximate) I/O last 3 completed shifts: In: -  Out: 1700 [Urine:1700]    FHT:  FHR: 150 bpm, variability: moderate,  accelerations:  Present,  decelerations:  Occasional early decels present UC:   regular, every 2 minutes SVE:  Dilation: 10 Dilation Complete Date: 08/28/15 Dilation Complete Time: 1820 Effacement (%): 100 Cervical Position: Anterior Station: +2 (caput) Presentation: Vertex Exam by:: Dr. Nancy MarusMayo, Enis SlipperJane Bailey, RN   Labs: Lab Results  Component Value Date   WBC 14.8* 08/28/2015   HGB 12.3 08/28/2015   HCT 35.9* 08/28/2015   MCV 84.7 08/28/2015   PLT 303 08/28/2015     Procedure: Attempted operative vaginal delivery with vacuum  Indication for operative vaginal delivery:  Failure of descent  Risks of vacuum assistance were discussed in detail, including but not limited to, bleeding, infection, damage to maternal tissues, fetal cephalohematoma, inability to effect vaginal delivery of the head or shoulder dystocia that cannot be resolved by established maneuvers and need for emergency cesarean section.  Patient gave verbal consent.  Patient was examined and found to be fully dilated with fetal station of +2. The soft vacuum soft cup was positioned over the sagittal suture 3 cm anterior to posterior fontanelle.  Pressure was then increased to 500 mmHg, and the patient was instructed to push.  Pulling was administered along the pelvic curve.  4 pulls were administered during 2 pushes, no popoffs. There was no  change in fetal station, no movement was appreciated.  The operative vaginal delivery was then stopped; patient informed that cesarean delivery was needed at his point.    Assessment / Plan: Induction of labor due to chronic HTN,  failure of descent and failed operative vaginal delivery. Reassuring fetal status. Patient needs cesarean delivery.  The risks of cesarean section discussed with the patient included but were not limited to: bleeding which may require transfusion or reoperation; infection which may require antibiotics; injury to bowel, bladder, ureters or other surrounding organs; injury to the fetus; need for additional procedures including hysterectomy in the event of a life-threatening hemorrhage; placental abnormalities wth subsequent pregnancies, incisional problems, thromboembolic phenomenon and other postoperative/anesthesia complications. The patient concurred with the proposed plan, giving informed written consent for the procedure.   Anesthesia and OR aware. Preoperative prophylactic antibiotics and SCDs ordered on call to the OR.  To OR when ready.   Tereso NewcomerANYANWU,Aidyn Sportsman A, MD 08/28/2015, 10:59 PM

## 2015-08-28 NOTE — Anesthesia Preprocedure Evaluation (Addendum)
Anesthesia Evaluation  Patient identified by MRN, date of birth, ID band Patient awake    Reviewed: Allergy & Precautions, H&P , NPO status , Patient's Chart, lab work & pertinent test results  Airway Mallampati: III  TM Distance: >3 FB Neck ROM: full    Dental no notable dental hx.    Pulmonary former smoker,    Pulmonary exam normal        Cardiovascular hypertension, Normal cardiovascular exam     Neuro/Psych negative neurological ROS     GI/Hepatic negative GI ROS, Neg liver ROS,   Endo/Other  Morbid obesity  Renal/GU negative Renal ROS     Musculoskeletal   Abdominal (+) + obese,   Peds  Hematology negative hematology ROS (+)   Anesthesia Other Findings   Reproductive/Obstetrics (+) Pregnancy                            Anesthesia Physical Anesthesia Plan  ASA: III and emergent  Anesthesia Plan: Epidural   Post-op Pain Management:    Induction:   Airway Management Planned: Simple Face Mask  Additional Equipment:   Intra-op Plan:   Post-operative Plan:   Informed Consent: I have reviewed the patients History and Physical, chart, labs and discussed the procedure including the risks, benefits and alternatives for the proposed anesthesia with the patient or authorized representative who has indicated his/her understanding and acceptance.   Dental advisory given  Plan Discussed with: CRNA, Anesthesiologist and Surgeon  Anesthesia Plan Comments:        Anesthesia Quick Evaluation

## 2015-08-28 NOTE — Progress Notes (Signed)
Hope BuddsHannah G Vessey is a 25 y.o. G1P0 at 6321w1d [facility]  Subjective: Pt comfortable.  Objective: BP 97/62 mmHg  Pulse 81  Temp(Src) 98.2 F (36.8 C) (Oral)  Resp 18  Ht 5\' 5"  (1.651 m)  Wt 370 lb (167.831 kg)  BMI 61.57 kg/m2  SpO2 99%  LMP 10/18/2014 (Approximate)   Total I/O In: -  Out: 675 [Urine:675]  FHT:  FHR: 140s bpm, variability: moderate,  accelerations:  Abscent,  decelerations:  Present Variables UC:   regular, every 2-3 minutes SVE:   Dilation: 4 Effacement (%): 80 Station: -2 Exam by:: Dr. Raechel ChuteGottstalk   Labs: Lab Results  Component Value Date   WBC 14.8* 08/28/2015   HGB 12.3 08/28/2015   HCT 35.9* 08/28/2015   MCV 84.7 08/28/2015   PLT 303 08/28/2015    Assessment / Plan: Induction - minimal change on pitocin.  IUPC placed  Labor: s/p foley balloon.  IUPC placed to improve use of pitocin Fetal Wellbeing:  Category II Pain Control:  Epidural I/D:  n/a Anticipated MOD:  NSVD  Janetta Vandoren JEHIEL 08/28/2015, 6:42 AM

## 2015-08-28 NOTE — Progress Notes (Signed)
Sara Vargas is a 25 y.o. G1P0 at 5171w1d by ultrasound admitted for induction of labor due to Hypertension.  Subjective: Pt is doing well. She is endorsing occasional left sided back pain that is improved with the epidural. She was having some low BPs overnight so her Labetalol was discontinued. This afternoon, she has been having systolic BPs into the 170s. No changes in vision, no RUQ pain.  Objective: BP 162/82 mmHg  Pulse 77  Temp(Src) 99.5 F (37.5 C) (Axillary)  Resp 18  Ht 5\' 5"  (1.651 m)  Wt 370 lb (167.831 kg)  BMI 61.57 kg/m2  SpO2 98%  LMP 10/18/2014 (Approximate) I/O last 3 completed shifts: In: -  Out: 675 [Urine:675]    FHT:  FHR: 150 bpm, variability: moderate,  accelerations:  Present,  decelerations:  Occasional variable decels present UC:   regular, every 2 minutes SVE:   Dilation: 10 Effacement (%): 100 Station: -1, 0 Exam by:: Sara SlipperJane Bailey, RN and Sara CarolKaty Christphor Groft, MD  Labs: Lab Results  Component Value Date   WBC 14.8* 08/28/2015   HGB 12.3 08/28/2015   HCT 35.9* 08/28/2015   MCV 84.7 08/28/2015   PLT 303 08/28/2015    Assessment / Plan: Induction of labor due to chronic HTN,  progressing well on pitocin.  Having some elevated BPs to 170s this afternoon, so will restart Labetalol 200mg  bid.  Labor: Progressing normally Preeclampsia:  no signs or symptoms of toxicity Fetal Wellbeing:  Category I Pain Control:  Epidural I/D:  GBS positive, on PCN Anticipated MOD:  NSVD    Sara BlossomKaty D Kimberla Vargas 08/28/2015, 4:24 PM

## 2015-08-28 NOTE — Progress Notes (Signed)
LABOR PROGRESS NOTE  Sara Vargas is a 25 y.o. G1P0 at 4159w0d  admitted for IOL for cHTN  Subjective: Patient reports that she is becoming increasingly uncomfortable, esp in her low back.  Is voicing concerns about getting epidural.  Objective: BP 146/58 mmHg  Pulse 86  Temp(Src) 98.1 F (36.7 C) (Oral)  Resp 18  Ht 5\' 5"  (1.651 m)  Wt 370 lb (167.831 kg)  BMI 61.57 kg/m2  SpO2 100%  LMP 10/18/2014 (Approximate) or  Filed Vitals:   08/27/15 2230 08/27/15 2300 08/27/15 2341 08/27/15 2342  BP: 128/81   146/58  Pulse: 84  86 86  Temp:  98.1 F (36.7 C)    TempSrc:  Oral    Resp: 18     Height:      Weight:      SpO2:   100%    Gen: awake, alert, uncomfortable  Fetal monitoringBaseline: 145 bpm, Variability: Fair (1-6 bpm), Accelerations: Reactive and Decelerations: Absent Uterine activity Frequency: Every 2-3 minutes   Dilation: 4 Effacement (%): 70 Cervical Position: Anterior Station: -3, Ballotable Presentation: Vertex Exam by:: Rogelia Rohrerebecca McCall RN   Labs: Lab Results  Component Value Date   WBC 14.8* 08/28/2015   HGB 12.3 08/28/2015   HCT 35.9* 08/28/2015   MCV 84.7 08/28/2015   PLT 303 08/28/2015    Patient Active Problem List   Diagnosis Date Noted  . Indication for care in labor or delivery 08/27/2015  . Abnormal MSAFP (maternal serum alpha-fetoprotein), elevated 04/12/2015  . Abnormal genetic test during pregnancy 04/02/2015  . Supervision of other high-risk pregnancy 01/18/2015  . Morbid obesity with BMI of 50.0-59.9, adult (HCC) 01/18/2015  . Chronic hypertension during pregnancy, antepartum   . ABNORMAL WEIGHT GAIN 04/13/2007    Assessment / Plan: 25 y.o. G1P0 at 2259w0d here for IOL for chronic HTN.  SROM @ 2200.  Cervix unchanged at that time.  Fetal scalp electrode placed around 2230.  Does not wish for epidural yet.  IV Fentanyl given again.  She appears veryuncomfortable.  She is now agreeable to epidural.  Will defer CE check until this has  been placed.  Labor: on 6 of Pitocin Fetal Wellbeing:  Cat 1 Pain Control:  Fentanyl IV given, epidural ordered Anticipated MOD:  SVD  Delynn FlavinAshly Gottschalk, DO PGY-2, [facility] 08/28/2015, 1:29 AM

## 2015-08-28 NOTE — Progress Notes (Signed)
LABOR PROGRESS NOTE  Sara Vargas is a 25 y.o. G1P0 at 7131w0d  admitted for IOL for cHTN  Subjective: Patient reports that she is much more comfortable after epidural.  Notes itching.  RN asking for IUPC.  Objective: BP 108/67 mmHg  Pulse 74  Temp(Src) 97.7 F (36.5 C) (Oral)  Resp 18  Ht 5\' 5"  (1.651 m)  Wt 370 lb (167.831 kg)  BMI 61.57 kg/m2  SpO2 99%  LMP 10/18/2014 (Approximate) or  Filed Vitals:   08/28/15 0314 08/28/15 0332 08/28/15 0401 08/28/15 0431  BP: 127/36 102/43 100/87 108/67  Pulse: 77 75 83 74  Temp:      TempSrc:      Resp:      Height:      Weight:      SpO2:       Gen: awake, alert, comfortable  Fetal monitoringBaseline: 150 bpm, Variability: Good {> 6 bpm), Accelerations: Reactive and Decelerations: Variable: mild Uterine activity Frequency: uncertain   Dilation: 4 Effacement (%): 70 Cervical Position: Anterior Station: -3 Presentation: Vertex Exam by:: Irving BurtonEmily Rothermel RN   Labs: Lab Results  Component Value Date   WBC 14.8* 08/28/2015   HGB 12.3 08/28/2015   HCT 35.9* 08/28/2015   MCV 84.7 08/28/2015   PLT 303 08/28/2015    Patient Active Problem List   Diagnosis Date Noted  . Indication for care in labor or delivery 08/27/2015  . Abnormal MSAFP (maternal serum alpha-fetoprotein), elevated 04/12/2015  . Abnormal genetic test during pregnancy 04/02/2015  . Supervision of other high-risk pregnancy 01/18/2015  . Morbid obesity with BMI of 50.0-59.9, adult (HCC) 01/18/2015  . Chronic hypertension during pregnancy, antepartum   . ABNORMAL WEIGHT GAIN 04/13/2007    Assessment / Plan: 25 y.o. G1P0 at 1031w0d here for IOL for chronic HTN.  SROM @ 2200.  Cervix essentially unchanged.  FSE placed around 2230.  Apparently fell out just before I came into the room.  IUPC attempted by myself and Illene BolusLori Clemmons, CNM.  Technically difficult exam.  Not able to advance catheter.  Will ask attending, Dr Adrian BlackwaterStinson, to attempt as well.  RN to replace  FSE  Labor: Pit Fetal Wellbeing:  Cat 1 Pain Control:  epidural Anticipated MOD:  SVD  Delynn FlavinAshly Raidyn Wassink, DO PGY-2, [facility] 08/28/2015, 5:27 AM

## 2015-08-28 NOTE — Anesthesia Procedure Notes (Signed)
Epidural Patient location during procedure: OB Start time: 08/28/2015 1:51 AM End time: 08/28/2015 1:55 AM  Staffing Anesthesiologist: Leilani AbleHATCHETT, Hermann Dottavio Performed by: anesthesiologist   Preanesthetic Checklist Completed: patient identified, surgical consent, pre-op evaluation, timeout performed, IV checked, risks and benefits discussed and monitors and equipment checked  Epidural Patient position: sitting Prep: site prepped and draped and DuraPrep Patient monitoring: continuous pulse ox and blood pressure Approach: midline Location: L3-L4 Injection technique: LOR air  Needle:  Needle type: Tuohy  Needle gauge: 17 G Needle length: 9 cm and 9 Needle insertion depth: 8 cm Catheter type: closed end flexible Catheter size: 19 Gauge Catheter at skin depth: 14 cm Test dose: negative and Other  Assessment Sensory level: T9 Events: blood not aspirated, injection not painful, no injection resistance, negative IV test and no paresthesia  Additional Notes Reason for block:procedure for pain

## 2015-08-29 ENCOUNTER — Encounter (HOSPITAL_COMMUNITY): Payer: Self-pay

## 2015-08-29 DIAGNOSIS — Z98891 History of uterine scar from previous surgery: Secondary | ICD-10-CM

## 2015-08-29 DIAGNOSIS — Z3A39 39 weeks gestation of pregnancy: Secondary | ICD-10-CM

## 2015-08-29 DIAGNOSIS — O1002 Pre-existing essential hypertension complicating childbirth: Secondary | ICD-10-CM

## 2015-08-29 DIAGNOSIS — O99344 Other mental disorders complicating childbirth: Secondary | ICD-10-CM

## 2015-08-29 DIAGNOSIS — O99824 Streptococcus B carrier state complicating childbirth: Secondary | ICD-10-CM

## 2015-08-29 DIAGNOSIS — Z6841 Body Mass Index (BMI) 40.0 and over, adult: Secondary | ICD-10-CM

## 2015-08-29 DIAGNOSIS — O99214 Obesity complicating childbirth: Secondary | ICD-10-CM

## 2015-08-29 DIAGNOSIS — Z87891 Personal history of nicotine dependence: Secondary | ICD-10-CM

## 2015-08-29 LAB — CBC
HCT: 34.7 % — ABNORMAL LOW (ref 36.0–46.0)
Hemoglobin: 11.6 g/dL — ABNORMAL LOW (ref 12.0–15.0)
MCH: 28.2 pg (ref 26.0–34.0)
MCHC: 33.4 g/dL (ref 30.0–36.0)
MCV: 84.2 fL (ref 78.0–100.0)
PLATELETS: 261 10*3/uL (ref 150–400)
RBC: 4.12 MIL/uL (ref 3.87–5.11)
RDW: 13.5 % (ref 11.5–15.5)
WBC: 23.3 10*3/uL — ABNORMAL HIGH (ref 4.0–10.5)

## 2015-08-29 MED ORDER — TETANUS-DIPHTH-ACELL PERTUSSIS 5-2.5-18.5 LF-MCG/0.5 IM SUSP: 0.5000 mL | Freq: Once | INTRAMUSCULAR | Status: DC

## 2015-08-29 MED ORDER — IBUPROFEN 600 MG PO TABS
600.0000 mg | ORAL_TABLET | Freq: Four times a day (QID) | ORAL | Status: DC
  Administered 2015-08-29 – 2015-08-31 (×9): 600 mg via ORAL
  Filled 2015-08-29 (×8): qty 1

## 2015-08-29 MED ORDER — SIMETHICONE 80 MG PO CHEW
80.0000 mg | CHEWABLE_TABLET | ORAL | Status: DC
  Administered 2015-08-29 – 2015-08-31 (×2): 80 mg via ORAL
  Filled 2015-08-29 (×2): qty 1

## 2015-08-29 MED ORDER — LACTATED RINGERS IV SOLN: INTRAVENOUS | Status: DC

## 2015-08-29 MED ORDER — LIDOCAINE-EPINEPHRINE 2 %-1:100000 IJ SOLN
INTRAMUSCULAR | Status: DC | PRN
  Administered 2015-08-29: 5 mL via INTRADERMAL

## 2015-08-29 MED ORDER — DIBUCAINE 1 % RE OINT: 1.0000 "application " | TOPICAL_OINTMENT | RECTAL | Status: DC | PRN

## 2015-08-29 MED ORDER — ACETAMINOPHEN 325 MG PO TABS: 650.0000 mg | ORAL_TABLET | ORAL | Status: DC | PRN

## 2015-08-29 MED ORDER — METHYLERGONOVINE MALEATE 0.2 MG/ML IJ SOLN
INTRAMUSCULAR | Status: DC | PRN
  Administered 2015-08-28: 0.2 mg via INTRAMUSCULAR

## 2015-08-29 MED ORDER — SIMETHICONE 80 MG PO CHEW
80.0000 mg | CHEWABLE_TABLET | Freq: Three times a day (TID) | ORAL | Status: DC
  Administered 2015-08-29 – 2015-08-31 (×7): 80 mg via ORAL
  Filled 2015-08-29 (×6): qty 1

## 2015-08-29 MED ORDER — PRENATAL MULTIVITAMIN CH
1.0000 | ORAL_TABLET | Freq: Every day | ORAL | Status: DC
  Administered 2015-08-29 – 2015-08-30 (×2): 1 via ORAL
  Filled 2015-08-29: qty 1

## 2015-08-29 MED ORDER — OXYTOCIN 10 UNIT/ML IJ SOLN: 2.5000 [IU]/h | INTRAVENOUS | Status: AC

## 2015-08-29 MED ORDER — LIDOCAINE-EPINEPHRINE (PF) 2 %-1:200000 IJ SOLN
INTRAMUSCULAR | Status: AC
  Filled 2015-08-29: qty 20

## 2015-08-29 MED ORDER — SODIUM BICARBONATE 8.4 % IV SOLN
INTRAVENOUS | Status: AC
  Filled 2015-08-29: qty 50

## 2015-08-29 MED ORDER — BUPIVACAINE HCL (PF) 0.5 % IJ SOLN
INTRAMUSCULAR | Status: DC | PRN
  Administered 2015-08-29: 30 mL

## 2015-08-29 MED ORDER — SIMETHICONE 80 MG PO CHEW: 80.0000 mg | CHEWABLE_TABLET | ORAL | Status: DC | PRN

## 2015-08-29 MED ORDER — DIPHENHYDRAMINE HCL 25 MG PO CAPS
25.0000 mg | ORAL_CAPSULE | Freq: Four times a day (QID) | ORAL | Status: DC | PRN
Start: 2015-08-29 — End: 2015-08-31

## 2015-08-29 MED ORDER — LABETALOL HCL 200 MG PO TABS
200.0000 mg | ORAL_TABLET | Freq: Two times a day (BID) | ORAL | Status: DC
  Filled 2015-08-29: qty 1

## 2015-08-29 MED ORDER — WITCH HAZEL-GLYCERIN EX PADS: 1.0000 "application " | MEDICATED_PAD | CUTANEOUS | Status: DC | PRN

## 2015-08-29 MED ORDER — ZOLPIDEM TARTRATE 5 MG PO TABS: 5.0000 mg | ORAL_TABLET | Freq: Every evening | ORAL | Status: DC | PRN

## 2015-08-29 MED ORDER — FENTANYL CITRATE (PF) 100 MCG/2ML IJ SOLN
INTRAMUSCULAR | Status: AC
  Filled 2015-08-29: qty 2

## 2015-08-29 MED ORDER — OXYCODONE HCL 5 MG PO TABS
5.0000 mg | ORAL_TABLET | ORAL | Status: DC | PRN
  Administered 2015-08-30 – 2015-08-31 (×3): 5 mg via ORAL
  Filled 2015-08-29 (×4): qty 1

## 2015-08-29 MED ORDER — MENTHOL 3 MG MT LOZG: 1.0000 | LOZENGE | OROMUCOSAL | Status: DC | PRN

## 2015-08-29 MED ORDER — OXYCODONE HCL 5 MG PO TABS: 10.0000 mg | ORAL_TABLET | ORAL | Status: DC | PRN

## 2015-08-29 MED ORDER — SENNOSIDES-DOCUSATE SODIUM 8.6-50 MG PO TABS
2.0000 | ORAL_TABLET | ORAL | Status: DC
  Administered 2015-08-29 – 2015-08-31 (×2): 2 via ORAL
  Filled 2015-08-29 (×2): qty 2

## 2015-08-29 MED ORDER — LACTATED RINGERS IV BOLUS (SEPSIS): 1000.0000 mL | Freq: Once | INTRAVENOUS | Status: DC

## 2015-08-29 MED ORDER — LANOLIN HYDROUS EX OINT: 1.0000 "application " | TOPICAL_OINTMENT | CUTANEOUS | Status: DC | PRN

## 2015-08-29 NOTE — Progress Notes (Signed)
Dr. Willadean CarolKaty Mayo informed of patient's oral fluid intake being 2700 cc fluids since 0553 while her urine output has been 1250 amber colored urine during that time frame. Patient has generatlized edema and +3 edema lower extremity pitting edema. Dr. Nancy MarusMayo was also informed that the patient's PICO dressing is nearly saturated with serosanquinous drainage, covering 80% of the dressing. The negative pressure seal is intact.   Dr. Willadean CarolKaty Mayo states that she will inform Dr. Ashok PallWouk of this information.

## 2015-08-29 NOTE — Lactation Note (Signed)
This note was copied from a baby's chart. Lactation Consultation Note: Baby now 6813 hours old. Mom reports she finally sucked about 1 hour ago. Baby sleepy at present- attempted to latch but baby too sleepy. Mom able to hand express some Colostrum - placed on her lips but she would not suck. Skin to skin with mom. Encouraged to watch for feeding cues and to call for assist when baby wakes up. No questions at present.  Patient Name: Sara Vargas [id number]'s Date: 08/29/2015 Reason for consult: Follow-up assessment   Maternal Data Formula Feeding for Exclusion: No Has patient been taught Hand Expression?: Yes Does the patient have breastfeeding experience prior to this delivery?: No  Feeding Feeding Type: Breast Fed  LATCH Score/Interventions Latch: Too sleepy or reluctant, no latch achieved, no sucking elicited.  Audible Swallowing: None  Type of Nipple: Everted at rest and after stimulation  Comfort (Breast/Nipple): Soft / non-tender     Hold (Positioning): Assistance needed to correctly position infant at breast and maintain latch. Intervention(s): Breastfeeding basics reviewed  LATCH Score: 5  Lactation Tools Discussed/Used Initiated by:: Milagros Reaponna Esker, RN Date initiated:: 08/29/15   Consult Status Consult Status: Follow-up Date: 08/29/15 Follow-up type: In-patient    Pamelia HoitWeeks, Mikiah Demond D 08/29/2015, 1:16 PM

## 2015-08-29 NOTE — Addendum Note (Signed)
Addendum  created 08/29/15 [id number] by Elbert Ewingsolleen S Ashmi Blas, CRNA   Modules edited: Orders

## 2015-08-29 NOTE — Addendum Note (Signed)
Addendum  created 08/29/15 0810 by Elbert Ewingsolleen S Dontrail Blackwell, CRNA   Modules edited: Clinical Notes   Clinical Notes:  File: [id number]

## 2015-08-29 NOTE — Op Note (Signed)
Sara BuddsHannah G Savannah PROCEDURE DATE: 08/29/2015  PREOPERATIVE DIAGNOSES: Intrauterine pregnancy at 7044w2d weeks gestation; failure to progress: arrest of descent, failed vacuum.  Morbid obesity (BMI 61)   POSTOPERATIVE DIAGNOSES: The same; Right hysterotomy extension (~5cm)  PROCEDURE: Primary Low Transverse Cesarean Section  SURGEON:  Dr. Jaynie CollinsUgonna Anyanwu, Dr. Lyndel SafeKimberly Newton (fellow)  ASSISTANT: Gala MurdochAhmed Farawi MS3  ANESTHESIOLOGIST: Dr. Krista BlueSinger  INDICATIONS: Sara Vargas is a 25 y.o. G1P0 at 6644w2d here for cesarean section secondary to the indications listed under preoperative diagnoses; please see preoperative note for further details.  The risks of cesarean section were discussed with the patient including but were not limited to: bleeding which may require transfusion or reoperation; infection which may require antibiotics; injury to bowel, bladder, ureters or other surrounding organs; injury to the fetus; need for additional procedures including hysterectomy in the event of a life-threatening hemorrhage; placental abnormalities wth subsequent pregnancies, incisional problems, thromboembolic phenomenon and other postoperative/anesthesia complications.   The patient concurred with the proposed plan, giving informed written consent for the procedure.    FINDINGS:  Viable female infant in cephalic, direct OP presentation.  Apgars 8 and 9.  Clear amniotic fluid.  Intact placenta, three vessel cord.  Normal uterus, fallopian tubes and ovaries bilaterally. Approximately 5 cm extension of hysterotomy to the level of the cervix on the right.  Mild adhesive disease with rectus muscle adhered to fascia.  Significant adipose tissue. Uterine atony noted and patient received methergine IV.   ANESTHESIA: Epidural INTRAVENOUS FLUIDS: 800 ml ESTIMATED BLOOD LOSS: 800 ml URINE OUTPUT:  430 ml SPECIMENS: Placenta sent to L&D COMPLICATIONS: None immediate  PROCEDURE IN DETAIL:  The patient preoperatively received  intravenous antibiotics and had sequential compression devices applied to her lower extremities.  She was then taken to the operating room where  the epidural anesthesia was dosed up to surgical level and was found to be adequate. She was then placed in a dorsal supine position with a leftward tilt, and prepped and draped in a sterile manner.  A foley catheter was placed into her bladder and attached to constant gravity. After an adequate timeout was performed, a Pfannenstiel skin incision was made with scalpel and carried through to the underlying layer of fascia. The fascia was incised in the midline, and this incision was extended bilaterally using the Mayo scissors.  Kocher clamps were applied to the superior aspect of the fascial incision and the underlying rectus muscles were dissected off with a combination of blunt and sharp dissection with bovie. A similar process was carried out on the inferior aspect of the fascial incision. The rectus muscles were separated in the midline bluntly and the peritoneum was entered bluntly. Attention was turned to the lower uterine segment where a low transverse hysterotomy was made with a scalpel and extended bilaterally bluntly. There was insufficient room for delivery and the hysterotomy was extended superiorly with bandage scissors.  The infant was successfully delivered, the cord was clamped and cut and the infant was handed over to awaiting neonatology team. Uterine massage was then administered, and the placenta delivered intact with a three-vessel cord. The uterus was then cleared of clot and debris.  Attention was turned to the right hysterotomy extension.  0 Vicryl was used to close the defect. The hysterotomy was closed with 0 Vicryl in a running locked fashion, and an imbricating layer was also placed with 0 Vicryl although it should be noted that there was denuded serosa inferiorly that was unable to incorporated into  the imbricating layer. Surgicel was placed  over this area to help with hemostasis.  The pelvis was cleared of all clot and debris. Hemostasis was confirmed on all surfaces.  The peritoneum was reapproximated using 0 Vicryl interrupted stitches. The fascia was then closed using 0 PDS in a running fashion.  The subcutaneous layer was reapproximated with 2-0 plain gut interrupted stitches, and 30 ml of 0.5% Marcaine was injected subcutaneously around the incision.  The skin was closed with a 4-0 Vicryl subcuticular stitch. The patient tolerated the procedure well. Sponge, lap, instrument and needle counts were correct x 2.  She was taken to the recovery room in stable condition.   Federico Flake, MD  Family Medicine, OB Fellow [facility]

## 2015-08-29 NOTE — Transfer of Care (Signed)
Immediate Anesthesia Transfer of Care Note  Patient: Sara BuddsHannah G Steffek  Procedure(s) Performed: Procedure(s): CESAREAN SECTION (N/A)  Patient Location: PACU  Anesthesia Type:Epidural  Level of Consciousness: awake, alert  and oriented  Airway & Oxygen Therapy: Patient Spontanous Breathing  Post-op Assessment: Report given to RN and Post -op Vital signs reviewed and stable  Post vital signs: Reviewed and stable  Last Vitals:  Filed Vitals:   08/28/15 2202 08/28/15 2231  BP: 160/91 128/110  Pulse: 98 97  Temp: 37.1 C   Resp: 16 18    Complications: No apparent anesthesia complications

## 2015-08-29 NOTE — Progress Notes (Signed)
Dr. Macon LargeAnyanwu discussing risks and benefits of a vacuum assisted delivery. Pt has no further questions

## 2015-08-29 NOTE — Anesthesia Postprocedure Evaluation (Signed)
Anesthesia Post Note  Patient: Sara BuddsHannah G Narvaez  Procedure(s) Performed: Procedure(s) (LRB): CESAREAN SECTION (N/A)  Patient location during evaluation: PACU Anesthesia Type: Epidural Level of consciousness: awake and alert Pain management: pain level controlled Vital Signs Assessment: post-procedure vital signs reviewed and stable Respiratory status: spontaneous breathing and respiratory function stable Cardiovascular status: blood pressure returned to baseline and stable Postop Assessment: spinal receding Anesthetic complications: no    Last Vitals:  Filed Vitals:   08/29/15 0115 08/29/15 0130  BP: 138/91 149/66  Pulse: 84 90  Temp:  36.8 C  Resp: 19 25    Last Pain:  Filed Vitals:   08/29/15 0145  PainSc: 0-No pain                 Lometa Riggin DANIEL

## 2015-08-29 NOTE — Brief Op Note (Signed)
08/27/2015 - 08/29/2015  12:58 AM  PATIENT:  Sara Vargas  25 y.o. female  PRE-OPERATIVE DIAGNOSIS:  primary Cesarean section Arrest of descent, failed vaccum  POST-OPERATIVE DIAGNOSIS:  primary Cesarean section Arrest of descent, failed vaccum  PROCEDURE:  Procedure(s): CESAREAN SECTION (N/A)  SURGEON:  Surgeon(s) and Role:    * Tereso NewcomerUgonna A Anyanwu, MD - Primary    * Federico FlakeKimberly Niles Dylan Monforte, MD - Assisting  ASSISTANTS: Gala MurdochAhmed Farawi MS3   ANESTHESIA:   local and epidural  EBL:  Total I/O In: 800 [I.V.:800] Out: 1230 [Urine:430; Blood:800]  BLOOD ADMINISTERED:none  DRAINS: none   LOCAL MEDICATIONS USED:  MARCAINE     SPECIMEN:  Source of Specimen:  Placenta  DISPOSITION OF SPECIMEN:  LD  COUNTS:  YES  TOURNIQUET:  * No tourniquets in log *  DICTATION: .Note written in EPIC  PLAN OF CARE: Admit to inpatient   PATIENT DISPOSITION:  PACU - hemodynamically stable.   Delay start of Pharmacological VTE agent (>24hrs) due to surgical blood loss or risk of bleeding: yes

## 2015-08-29 NOTE — Lactation Note (Signed)
This note was copied from a baby's chart. Lactation Consultation Note New mom w/pendulum cone shaped breast w/everted nipple. Breast tight/shorter close to chest at bottom base of breast. Limited mobility in lifting breast up for BF. Hand expressed colostrum. Baby hasn't wanted to BF. Awake looking around, sucked on RN finger, wouldn't suckle on LC finger.wouldn't latch to moms breast. Has no interest in BF at this time. Mom obesed w/abd. Distended from C-section. Encouraged to feed in football position. Mom encouraged to feed baby 8-12 times/24 hours and with feeding cues. Referred to Baby and Me Book in Breastfeeding section Pg. 22-23 for position options and Proper latch demonstration. Educated about newborn behavior, STS, I&O. WH/LC brochure given w/resources, support groups and LC services. Patient Name: Sara Sherley BoundsHannah Gellis ZOXWR'UToday's Date: 08/29/2015 Reason for consult: Initial assessment   Maternal Data    Feeding Feeding Type: Breast Fed Length of feed: 0 min  LATCH Score/Interventions Latch: Too sleepy or reluctant, no latch achieved, no sucking elicited. Intervention(s): Skin to skin;Teach feeding cues;Waking techniques  Audible Swallowing: None Intervention(s): Hand expression  Type of Nipple: Everted at rest and after stimulation  Comfort (Breast/Nipple): Soft / non-tender     Hold (Positioning): Full assist, staff holds infant at breast Intervention(s): Skin to skin;Position options;Support Pillows;Breastfeeding basics reviewed  LATCH Score: 4  Lactation Tools Discussed/Used WIC Program: Yes   Consult Status Consult Status: Follow-up Date: 08/29/15 Follow-up type: In-patient    [place] Wherry, Diamond NickelLAURA G 08/29/2015, 6:50 AM

## 2015-08-29 NOTE — Progress Notes (Signed)
Dr. Macon LargeAnyanwu at bedside discussing risks and benefits of C/S. Pt has no further questions. Consents signed.

## 2015-08-29 NOTE — Anesthesia Postprocedure Evaluation (Signed)
Anesthesia Post Note  Patient: Sara Vargas  Procedure(s) Performed: Procedure(s) (LRB): CESAREAN SECTION (N/A)  Patient location during evaluation: Mother Baby Anesthesia Type: Epidural Level of consciousness: awake and alert and oriented Pain management: pain level controlled Vital Signs Assessment: post-procedure vital signs reviewed and stable Respiratory status: spontaneous breathing, nonlabored ventilation and respiratory function stable Cardiovascular status: stable Postop Assessment: no headache, no backache, patient able to bend at knees, no signs of nausea or vomiting and adequate PO intake Anesthetic complications: no Comments: Patient complaining of dizziness. Urine concentrated. Blood loss 800ml and received 1100ml LR during C-section. 1000ml fluid bolus of LR ordered.    Last Vitals:  Filed Vitals:   08/29/15 0603 08/29/15 0607  BP: 128/59 127/78  Pulse: 103 112  Temp:    Resp:      Last Pain:  Filed Vitals:   08/29/15 0705  PainSc: 3                  Ardie Dragoo

## 2015-08-29 NOTE — Progress Notes (Signed)
Post Partum Day 1 Subjective:  Sara Vargas is a 25 y.o. G1P1000 8914w1d IOL for cHTN s/p pLTCS for FTD / failed VAVD.  No acute events overnight.  Pt has not been able to ambulate. States that she is dizzy in bed as well as when she attempts to stand.Tolerating PO. She denies nausea or vomiting.  Pain is well controlled. Still has foley catheter in place. She has not had flatus. She has not had bowel movement.  Lochia appropriate. Saturated incision dressing.   Objective: Blood pressure 127/78, pulse 112, temperature 98.4 F (36.9 C), temperature source Oral, resp. rate 20, height 5\' 5"  (1.651 m), weight 167.831 kg (370 lb), last menstrual period 10/18/2014, SpO2 97 %, unknown if currently breastfeeding.     Physical Exam:  General: alert, cooperative, fatigued and morbidly obese Lochia: appropriate Uterine Fundus: firm, U-1 Incision: bleeding through bandage DVT Evaluation: Negative Homan's sign. No cords or calf tenderness.   Recent Labs  08/28/15 0040 08/29/15 0548  HGB 12.3 11.6*  HCT 35.9* 34.7*    Assessment/Plan:  1. Dizziness - patient is symptomatic, orthostatics completed at approx. 0600 wnl, continue monitoring. Repeat CBC tomorrow in the AM if symptoms persist.  Plan for discharge Friday   LOS: 2 days   Ahmed Eloise HarmanFarawi 08/29/2015, 7:18 AM   OB fellow attestation Post Partum Day 1/POD#1 I have seen and examined this patient and agree with above documentation in the medical student's note.   Sara Vargas is a 25 y.o. G1P1001 s/p pLTCS for arrest of descent, failed VAVD.  Pt denies problems with ambulating or po intake. Catheter still in place. Pain is well controlled.  Plan for birth control is Nexplanon vs OCP.  Method of Feeding: Breast.  PE:  BP 127/78 mmHg  Pulse 112  Temp(Src) 98.4 F (36.9 C) (Oral)  Resp 20  Ht 5\' 5"  (1.651 m)  Wt 370 lb (167.831 kg)  BMI 61.57 kg/m2  SpO2 97%  LMP 10/18/2014 (Approximate)  Breastfeeding? Unknown Gen: well  appearing Heart: reg rate Lungs: normal WOB Fundus firm Ext: soft, no pain, no edema Incision: 50% saturated PECO  Plan for discharge: POD#2 vs POD#3 Reviewed labor events and timing of C-section with family.  Discussed wound care briefly Routine Post op care  Federico FlakeKimberly Niles Newton, MD 9:45 AM

## 2015-08-30 ENCOUNTER — Encounter (HOSPITAL_COMMUNITY): Payer: Self-pay | Admitting: Obstetrics & Gynecology

## 2015-08-30 NOTE — Progress Notes (Signed)
At 2207 spoke with F Cres-Dishmon CNM regarding patient's BP and drainage on dressing. Order received to hold dose of labetalol and she came to inspect dressing. No dressing change required at this time. Green light on PICO unit on and flashing.

## 2015-08-30 NOTE — Lactation Note (Signed)
This note was copied from a baby's chart. Lactation Consultation Note  Patient Name: Sara Sherley BoundsHannah Vargas ZOXWR'UToday's Date: 08/30/2015 Reason for consult: Follow-up assessment Parents asked for formula to supplement this am, worried baby not getting enough at the breast. Mom reports baby cluster fed during the night then this am has been sleepy and would not latch, spit up 2 times. Parents gave approx 2 ml of formula about 10:00. Last good BF was at 0230 this am. Advised parents baby should be taking more volume at this point and offered to assist Mom with latch or if she does not want to latch to give baby more supplement. Mom reports she did not want to wake baby at this time but will try to feed baby again within the hour. LC offered to assist Mom with latch if she wants to continue to BF, Mom to advise. Discussed pump/bottle feeding to provide EBM. Advised with BF/BO feeding, Mom needs to BF with each feeding before giving bottles to encourage milk production, prevent engorgement and protect milk supply. Advised now that baby is over 24 hours old, should be at the breast 8-12 times in 24 hours and with feeding ques, nursing for 15-20 minutes. If Mom decides to pump/bottle, advised she needs to pump every 3 hours for 15 minutes. Parents now sure about feeding choice, may change to bottle/formula feeding. Mom to call for assist if desire to work with latch.   Maternal Data    Feeding Feeding Type: Formula Length of feed: 0 min  LATCH Score/Interventions                      Lactation Tools Discussed/Used Tools: Pump Breast pump type: Double-Electric Breast Pump   Consult Status Consult Status: Follow-up Date: 08/30/15 Follow-up type: In-patient    Alfred LevinsGranger, Tully Mcinturff Ann 08/30/2015, 10:52 AM

## 2015-08-30 NOTE — Lactation Note (Signed)
This note was copied from a baby's chart. Lactation Consultation Note  Patient Name: Sara Sherley BoundsHannah Vargas ZOXWR'UToday's Date: 08/30/2015 Reason for consult: Follow-up assessment Mom called for assist with latch. Took few attempts and demonstrating breast compression (tea cup hold) for baby to sustain latch. Mom nipples flatten slightly with breast compression but baby can latch without the nipple shield and sustain the latch.  Baby demonstrated some good suckling bursts with swallows noted. Nipple was round when baby came off the breast. Encouraged Mom to keep offering the breast with each feeding. Reminded Mom baby should be at the breast 8-12 times in 24 hours and with feeding ques. Mom will probably continue to supplement. Advised if baby does not go to the breast with the feeding Mom needs to pump for 15 minutes to encourage milk production.  Encouraged Mom to call for assist as needed.   Maternal Data    Feeding Feeding Type: Breast Fed Length of feed: 15 min  LATCH Score/Interventions Latch: Repeated attempts needed to sustain latch, nipple held in mouth throughout feeding, stimulation needed to elicit sucking reflex. Intervention(s): Adjust position;Assist with latch;Breast massage;Breast compression  Audible Swallowing: A few with stimulation  Type of Nipple: Everted at rest and after stimulation (short nipple shafts bilateral)  Comfort (Breast/Nipple): Soft / non-tender     Hold (Positioning): Assistance needed to correctly position infant at breast and maintain latch. Intervention(s): Breastfeeding basics reviewed;Support Pillows;Position options;Skin to skin  LATCH Score: 7  Lactation Tools Discussed/Used Tools: Pump Breast pump type: Double-Electric Breast Pump   Consult Status Consult Status: Follow-up Date: 08/31/15 Follow-up type: In-patient    Alfred LevinsGranger, Anmol Paschen Ann 08/30/2015, 2:21 PM

## 2015-08-30 NOTE — Progress Notes (Signed)
Post Operative Day 2 Subjective: up ad lib, voiding, tolerating PO and + flatus. States she is still having some lower abdominal pain around her incision.   Objective: Blood pressure 122/59, pulse 94, temperature 98.1 F (36.7 C), temperature source Oral, resp. rate 18, height 5\' 5"  (1.651 m), weight 370 lb (167.831 kg), last menstrual period 10/18/2014, SpO2 98 %, unknown if currently breastfeeding.  Physical Exam:  General: alert, cooperative and no distress Lochia: appropriate Uterine Fundus: firm Incision: healing well, no significant drainage DVT Evaluation: No evidence of DVT seen on physical exam.   Recent Labs  08/28/15 0040 08/29/15 0548  HGB 12.3 11.6*  HCT 35.9* 34.7*    Assessment/Plan: Plan for discharge tomorrow   LOS: 3 days   Hilton SinclairKaty D Mayo 08/30/2015, 1:01 PM   OB fellow attestation: I have seen and examined this patient; I agree with above documentation in the resident's note.   Federico FlakeKimberly Niles Sarinah Doetsch, MD 9:15 PM

## 2015-08-31 MED ORDER — OXYCODONE HCL 5 MG PO TABS: 5.0000 mg | ORAL_TABLET | ORAL | Status: DC | PRN

## 2015-08-31 MED ORDER — IBUPROFEN 600 MG PO TABS: 600.0000 mg | ORAL_TABLET | Freq: Four times a day (QID) | ORAL | Status: DC

## 2015-08-31 NOTE — Lactation Note (Signed)
This note was copied from a baby's chart. Lactation Consultation Note  Patient Name: Sara Sherley BoundsHannah Symanski ZOXWR'UToday's Date: 08/31/2015 Reason for consult: Follow-up assessment Mom reports baby has been sleepy at the breast. Last feeding was for 6 minutes. Prior feeding was around 0600 this am for 40 minutes, had bottle during the night. Stressed to [employer] InternationalMom the importance of baby being at breast 8-12 times in 24 hours and with feeding ques, sustaining the latch for 15-20 minutes both breasts some feedings. Bili 10.6 at 54 hours of age which is in low/intermediate zone for photo therapy. Weight loss at 7% with last weight check. At this visit baby sleepy and Mom was trying to latch baby in cradle hold but baby could not obtain good depth. Changed to football hold but this is uncomfortable for Mom. Tried nipple shield but this was not effective to help with latch. Baby started to wake up and Mom tried in cradle hold again and baby latched with good depth. Baby does come on/off the breast during the feeding but will re-latch. Baby nursed at this visit for 25 minutes on/off, demonstrated some good suckling bursts, some swallows noted.  Advised Mom to start offering breast with each feeding. If baby sleepy and will not latch, place baby STS and try again within the hour. Advised breasts need some stimulation at least every 3 hours to encourage milk production, prevent engorgement and protect milk supply. When baby is at the breast keep baby nursing at least 15-20 minutes, both breasts when possible. Baby also needs to feeding every 3 hours.  If baby will not latch, sustain latch for the 15-20 minutes, or FOB wants to supplement, then Mom needs to pump for 15 minutes. Supplement according to guidelines given. Mom getting DEBP from her sister. Engorgement care reviewed if needed. Advised to refer to Baby N Me booklet, page 24. Breast milk storage guidelines page 25. Advised of OP services and support group. Encouraged to  call for questions/concerns.    Maternal Data    Feeding Feeding Type: Breast Fed Length of feed: 25 min (off/on)  LATCH Score/Interventions Latch: Grasps breast easily, tongue down, lips flanged, rhythmical sucking. Intervention(s): Adjust position;Assist with latch;Breast massage;Breast compression  Audible Swallowing: A few with stimulation  Type of Nipple: Everted at rest and after stimulation (short nipple shafts bilateral)  Comfort (Breast/Nipple): Soft / non-tender     Hold (Positioning): No assistance needed to correctly position infant at breast. Intervention(s): Breastfeeding basics reviewed;Support Pillows;Position options;Skin to skin  LATCH Score: 9  Lactation Tools Discussed/Used Tools: Pump Breast pump type: Double-Electric Breast Pump   Consult Status Consult Status: Complete Date: 08/31/15 Follow-up type: In-patient    Alfred LevinsGranger, Quina Wilbourne Ann 08/31/2015, 11:23 AM

## 2015-08-31 NOTE — Discharge Summary (Signed)
OB Discharge Summary     Patient Name: Sara Vargas DOB: Oct 22, 1990 MRN: [id number]  Date of admission: 08/27/2015 Delivering MD: Jaynie Collins A   Date of discharge: 08/31/2015  Admitting diagnosis: INDUCTION Intrauterine pregnancy: [redacted]w[redacted]d     Secondary diagnosis:  Active Problems:   Supervision of other high-risk pregnancy   Morbid obesity with BMI of 50.0-59.9, adult (HCC)   Chronic hypertension during pregnancy, antepartum   Abnormal genetic test during pregnancy   Abnormal MSAFP (maternal serum alpha-fetoprotein), elevated   Indication for care in labor or delivery   Status post primary low transverse cesarean section  Additional problems: none     Discharge diagnosis: Term Pregnancy Delivered                                                                                                Post partum procedures:none  Augmentation: AROM, Pitocin, Cytotec and Foley Balloon  Complications: failed vacuum  Hospital course:  Induction of Labor With Cesarean Section  25 y.o. yo G1P1001 at [redacted]w[redacted]d was admitted to the hospital 08/27/2015 for induction of labor. Patient had a labor course significant for attempted vacuum, failed. The patient went for cesarean section due to Arrest of Descent, and delivered a Viable infant,@BABYSUPPRESS (DBLINK,ept,110,,1,,) Membrane Rupture Time/Date: )10:10 PM ,08/27/2015    of operation can be found in separate operative Note.  Patient had an uncomplicated postpartum course. Her BP were not high enough to continue antihypertensive meds. She is ambulating, tolerating a regular diet, passing flatus, and urinating well.  Patient is discharged home in stable condition on 08/31/2015.                                     Physical exam  Filed Vitals:   08/30/15 1000 08/30/15 1900 08/30/15 2159 08/31/15 0542  BP: 131/59 129/57 143/56 135/64  Pulse:  89  92  Temp:  98.3 F (36.8 C)  98.5 F (36.9 C)  TempSrc:  Oral  Oral  Resp:  18  20  Height:       Weight:      SpO2:       General: alert, cooperative and no distress Lochia: appropriate Uterine Fundus: firm Incision: Dressing is clean, dry, and intact DVT Evaluation: No evidence of DVT seen on physical exam. Labs: Lab Results  Component Value Date   WBC 23.3* 08/29/2015   HGB 11.6* 08/29/2015   HCT 34.7* 08/29/2015   MCV 84.2 08/29/2015   PLT 261 08/29/2015   CMP Latest Ref Rng 08/08/2015  Glucose 65 - 99 mg/dL 84  BUN 6 - 20 mg/dL 10  Creatinine 4.09 - 8.11 mg/dL 9.14  Sodium 782 - 956 mmol/L 139  Potassium 3.5 - 5.1 mmol/L 4.2  Chloride 101 - 111 mmol/L 109  CO2 22 - 32 mmol/L 25  Calcium 8.9 - 10.3 mg/dL 9.0  Total Protein 6.5 - 8.1 g/dL 6.3(L)  Total Bilirubin 0.3 - 1.2 mg/dL 0.8  Alkaline Phos 38 - 126 U/L 121  AST 15 - 41  U/L 21  ALT 14 - 54 U/L 48    Discharge instruction: per After Visit Summary and "Baby and Me Booklet".  After visit meds:    Medication List    STOP taking these medications        acetaminophen 500 MG tablet  Commonly known as:  TYLENOL     calcium carbonate 500 MG chewable tablet  Commonly known as:  TUMS - dosed in mg elemental calcium     labetalol 200 MG tablet  Commonly known as:  NORMODYNE      TAKE these medications        ibuprofen 600 MG tablet  Commonly known as:  ADVIL,MOTRIN  Take 1 tablet (600 mg total) by mouth every 6 (six) hours.     multivitamin-prenatal 27-0.8 MG Tabs tablet  Take 1 tablet by mouth daily at 12 noon.     oxyCODONE 5 MG immediate release tablet  Commonly known as:  Oxy IR/ROXICODONE  Take 1 tablet (5 mg total) by mouth every 4 (four) hours as needed (pain scale 4-7).        Diet: routine diet  Activity: Advance as tolerated. Pelvic rest for 6 weeks.   Outpatient follow EX:BMWUup:make appt for would check 1-2 weeks Follow up Appt:Future Appointments Date Time Provider Department Center  09/27/2015 9:30 AM Jacklyn ShellFrances Cresenzo-Dishmon, CNM [facility]   Follow up Visit:No Follow-up on  file.  Postpartum contraception: Nexplanon  Newborn Data: Live born female  Birth Weight: 6 lb 6.1 oz (2895 g) APGAR: 8, 9  Baby Feeding: Breast Disposition:home with mother   08/31/2015 Greig RightRESENZO-DISHMAN,Otillia Cordone, CNM

## 2015-08-31 NOTE — Progress Notes (Signed)
Scant amt of drainage noted in tubing of PICO dressing and amt of drainage on pressure dressing increased. Pressure dressing removed and PICO dressing noted to be saturated with serosanginous drainage.  PICO dressing removed and new one applied. Scant amt of drainage along incision, otherwise intact

## 2015-08-31 NOTE — Progress Notes (Signed)
CSW received request for consult due to MOB presenting with a history of depression and anxiety.   CSW completed full assessment, full documentation to follow. No barriers to discharge.

## 2015-08-31 NOTE — Discharge Instructions (Signed)
Cesarean Delivery, Care After  Refer to this sheet in the next few weeks. These instructions provide you with information on caring for yourself after your procedure. Your health care provider may also give you specific instructions. Your treatment has been planned according to current medical practices, but problems sometimes occur. Call your health care provider if you have any problems or questions after you go home.  HOME CARE INSTRUCTIONS   Only take over-the-counter or prescription medications as directed by your health care provider.   Do not drink alcohol, especially if you are breastfeeding or taking medication to relieve pain.   Do not chew or smoke tobacco.   Continue to use good perineal care. Good perineal care includes:    Wiping your perineum from front to back.    Keeping your perineum clean.   Check your surgical cut (incision) daily for increased redness, drainage, swelling, or separation of skin.   Clean your incision gently with soap and water every day, and then pat it dry. If your health care provider says it is okay, leave the incision uncovered. Use a bandage (dressing) if the incision is draining fluid or appears irritated. If the adhesive strips across the incision do not fall off within 7 days, carefully peel them off.   Hug a pillow when coughing or sneezing until your incision is healed. This helps to relieve pain.   Do not use tampons or douche until your health care provider says it is okay.   Shower, wash your hair, and take tub baths as directed by your health care provider.   Wear a well-fitting bra that provides breast support.   Limit wearing support panties or control-top hose.   Drink enough fluids to keep your urine clear or pale yellow.   Eat high-fiber foods such as whole grain cereals and breads, brown rice, beans, and fresh fruits and vegetables every day. These foods may help prevent or relieve constipation.   Resume activities such as climbing stairs,  driving, lifting, exercising, or traveling as directed by your health care provider.   Talk to your health care provider about resuming sexual activities. This is dependent upon your risk of infection, your rate of healing, and your comfort and desire to resume sexual activity.   Try to have someone help you with your household activities and your newborn for at least a few days after you leave the hospital.   Rest as much as possible. Try to rest or take a nap when your newborn is sleeping.   Increase your activities gradually.   Keep all of your scheduled postpartum appointments. It is very important to keep your scheduled follow-up appointments. At these appointments, your health care provider will be checking to make sure that you are healing physically and emotionally.  SEEK MEDICAL CARE IF:    You are passing large clots from your vagina. Save any clots to show your health care provider.   You have a foul smelling discharge from your vagina.   You have trouble urinating.   You are urinating frequently.   You have pain when you urinate.   You have a change in your bowel movements.   You have increasing redness, pain, or swelling near your incision.   You have pus draining from your incision.   Your incision is separating.   You have painful, hard, or reddened breasts.   You have a severe headache.   You have blurred vision or see spots.   You feel sad   or depressed.   You have thoughts of hurting yourself or your newborn.   You have questions about your care, the care of your newborn, or medications.   You are dizzy or light-headed.   You have a rash.   You have pain, redness, or swelling at the site of the removed intravenous access (IV) tube.   You have nausea or vomiting.   You stopped breastfeeding and have not had a menstrual period within 12 weeks of stopping.   You are not breastfeeding and have not had a menstrual period within 12 weeks of delivery.   You have a fever.  SEEK  IMMEDIATE MEDICAL CARE IF:   You have persistent pain.   You have chest pain.   You have shortness of breath.   You faint.   You have leg pain.   You have stomach pain.   Your vaginal bleeding saturates 2 or more sanitary pads in 1 hour.  MAKE SURE YOU:    Understand these instructions.   Will watch your condition.   Will get help right away if you are not doing well or get worse.     This information is not intended to replace advice given to you by your health care provider. Make sure you discuss any questions you have with your health care provider.     Document Released: 02/01/2002 Document Revised: 06/02/2014 Document Reviewed: 01/07/2012  Elsevier Interactive Patient Education 2016 [employer] Inc[employer]

## 2015-08-31 NOTE — Clinical Social Work Maternal (Signed)
CLINICAL SOCIAL WORK MATERNAL/CHILD NOTE  Patient Details  Name: Sara Vargas MRN: [id number] Date of Birth: 08/20/1990  Date:  2016/01/06  Clinical Social Worker Initiating Note:  Lucita Ferrara MSW, LCSW Date/ Time Initiated:  08/31/15/0900     Child's Name:  Raelyn   Legal Guardian:  Sabino Dick and Gertha Calkin  Need for Interpreter:  None   Date of Referral:  22-Jul-2015     Reason for Referral:  History of anxiety and depression  Referral Source:  [facility]   Address:  [address] [place], [place] [address]  Phone number:  [phone]   Household Members:  Parents   Natural Supports (not living in the home):  Spouse/significant other, Immediate Family, Extended Family   Professional Supports: None   Employment:     Type of Work: [employer]   Education:    N/A  Museum/gallery curator Resources:  [employer]   Other Resources:  [employer]   Cultural/Religious Considerations Which May Impact Care:  None reported  Strengths:  Ability to meet basic needs , Home prepared for child , Pediatrician chosen    Risk Factors/Current Problems:  Mental Health Concerns    Cognitive State:  Able to Concentrate , Alert , Goal Oriented , Linear Thinking    Mood/Affect:  Euthymic , Comfortable , Calm    CSW Assessment:  CSW received request for consult due to MOB presenting with a history of anxiety and depression.  MOB reported feeling tired and exhausted, and her range of affect was congruent with her reported mood.  MOB stated that she has slept minimally since being at the hospital, and feels ready for discharge.  MOB reported that she lives with her mother, and has a positive and supportive relationship with her mother and the FOB.  MOB confirmed that the home is prepared for the infant and all basic needs are met.  MOB confirmed history of anxiety and depression since late adolescence.  She stated that she has a history of being prescribed Xanax and another medication that she  could not recall, but reported that she has not been on medication in years due to not having insurance and not having access to funds to pay for medications. MOB denied prior history of participating in therapy.  MOB denied acute depression and anxiety during the pregnancy, but did report that she has felt "anxious" since the infant has been born. She shared that she is anxious about the health of the infant, and was receptive to exploring evidence for and against her anxious thoughts. MOB was able to reflect upon the health updates from nursing staff and the pediatrician, and shared that she has only received reports that the infant is healthy.  MOB also expressed feeling anxious about her parenting. She stated that she continues to focus on what other people have been saying (that she is good mother). MOB struggled to identify her own beliefs that she is a good mother, but repeated numerous times that other people feel like she is a good mother.  She continued to express anxiety about being home alone with the infant, but also stated that it is likely due to concerns about her ability to balance caring for herself (s/p C-section) and the infant. MOB stated that she will not be home alone immediately postpartum, and that she has support that is available via telephone if she is alone. MOB recognized that motherhood and learning the infant's cues is a learning process, and shared she knows that she needs to  have patience with herself.    CSW continued to provide education on the baby blues and perinatal mood disorders.  CSW shared impressions that MOB presents with symptoms of perinatal anxiety, and encouraged MOB to follow up with her medical provider if she notes ongoing symptoms, increase in symptoms, or if symptoms cause negative outcomes.  CSW informed MOB of available evidence based treatments, and MOB denied need for help at this time. She expressed appreciation for the information, education, and support,  and agreed to follow up with her medical provider if need arise.  CSW reviewed self-care, and MOB expressed intention to sleep and care for herself as she transitions home.   CSW Plan/Description:  Patient/Family Education , No Further Intervention Required/No Barriers to Discharge    Jaycie Kregel N, LCSW 08/31/2015, 1:38 PM 

## 2015-09-04 ENCOUNTER — Encounter: Payer: Self-pay | Admitting: Women's Health

## 2015-09-04 ENCOUNTER — Ambulatory Visit (INDEPENDENT_AMBULATORY_CARE_PROVIDER_SITE_OTHER): Payer: Medicaid Other | Admitting: Women's Health

## 2015-09-04 VITALS — BP 118/70 | HR 74 | Ht 65.0 in | Wt 360.4 lb

## 2015-09-04 DIAGNOSIS — R600 Localized edema: Secondary | ICD-10-CM | POA: Diagnosis not present

## 2015-09-04 DIAGNOSIS — Z09 Encounter for follow-up examination after completed treatment for conditions other than malignant neoplasm: Secondary | ICD-10-CM | POA: Diagnosis not present

## 2015-09-04 DIAGNOSIS — R519 Headache, unspecified: Secondary | ICD-10-CM

## 2015-09-04 DIAGNOSIS — R51 Headache: Secondary | ICD-10-CM | POA: Diagnosis not present

## 2015-09-04 DIAGNOSIS — Z4889 Encounter for other specified surgical aftercare: Secondary | ICD-10-CM

## 2015-09-04 NOTE — Progress Notes (Signed)
Patient ID: Sara Vargas, female   DOB: 05/24/1991, 25 y.o.   MRN: [id number]   [facility]  Patient name: Sara Vargas MRN [id number]  Date of birth: 06/03/1990  CC & HPI:  Sara Vargas is a 25 y.o. 571P1001 Caucasian female 1wk s/p PLTCS [facility] presenting today for incision check. Has had on wound vac- states pump came off yesterday. Reports headaches- doesn't think she's getting enough sleep. Not really getting enough fluid either. Swelling in legs.  Not on any bp meds- states bp was too low in hospital. Breast and bottlefeeding. Plans for nexplanon.  No LMP recorded.  Pertinent History Reviewed:  Medical & Surgical Hx:   Past medical, surgical, family, and social history reviewed in electronic medical record Medications: Reviewed & Updated - see associated section Allergies: Reviewed in electronic medical record  Objective Findings:  Vitals: BP 118/70 mmHg  Pulse 74  Ht 5\' 5"  (1.651 m)  Wt 360 lb 6.4 oz (163.476 kg)  BMI 59.97 kg/m2  Breastfeeding? Yes Body mass index is 59.97 kg/(m^2).  Physical Examination: General appearance - alert, well appearing, and in no distress Abdomen - wound vac dressing removed, c/s incision healing very well, no open areas, no drainage Extremities - trace edema bilateral lower extremities  No results found for this or any previous visit (from the past 24 hour(s)).   Assessment & Plan:  A:   1wk s/p PLTCS  Incision check  Extremity swelling  Breast and bottlefeeding  Headaches  P:  Keep incision clean and dry, put peri pad under panus to help  Elevate legs above level of heart, drink water w/ lemon slices to help w/ swelling  BP great today, no need for bp meds/fluid pills at this time  Gave printed info for ha prevention/relief measures- let us know if not helping  Increase po fluids  Return for order nexplanon today please, then keep next appt as scheduled. 5/4 for pp visit  Abstinence until nexplanon  placement  Marge DuncansBooker, Amayrani Bennick Randall CNM, [facility]NP-BC 09/04/2015 10:54 AM

## 2015-09-04 NOTE — Patient Instructions (Addendum)
For Headaches:   Stay well hydrated, drink enough water so that your urine is clear, sometimes if you are dehydrated you can get headaches  Eat small frequent meals and snacks, sometimes if you are hungry you can get headaches  Sometimes you get headaches during pregnancy from the pregnancy hormones  You can try tylenol (1-2 regular strength 325mg  or 1-2 extra strength 500mg ) as directed on the box. You can also try ibuprofen as directed on the bottle. The least amount of medication that works is best.   Cool compresses (cool wet washcloth or ice pack) to area of head that is hurting  You can also try drinking a caffeinated drink to see if this will help  If still not helping, try below:  For Prevention of Headaches/Migraines:  CoQ10 100mg  three times daily  Vitamin B2 400mg  daily  Magnesium Oxide 400-600mg  daily  If You Get a Bad Headache/Migraine:  Benadryl 25mg    Magnesium Oxide  1 large Gatorade  2 extra strength Tylenol (1,000mg  total)  1 cup coffee or Coke  If this doesn't help please call us @ [phone]   Prop legs up above the level of your heart Drink lots of water with lemon slices in it  NO SEX UNTIL AFTER YOU GET YOUR BIRTH CONTROL

## 2015-09-27 ENCOUNTER — Ambulatory Visit (INDEPENDENT_AMBULATORY_CARE_PROVIDER_SITE_OTHER): Payer: Medicaid Other | Admitting: Advanced Practice Midwife

## 2015-09-27 ENCOUNTER — Encounter: Payer: Self-pay | Admitting: Advanced Practice Midwife

## 2015-09-27 MED ORDER — AMLODIPINE BESYLATE 2.5 MG PO TABS: 5.0000 mg | ORAL_TABLET | Freq: Every day | ORAL | Status: DC

## 2015-09-27 NOTE — Progress Notes (Signed)
  Sara BuddsHannah G Vargas is a 25 y.o. who presents for a postpartum visit. She is 4 weeks postpartum following a low cervical transverse Cesarean section. I have fully reviewed the prenatal and intrapartum course. The delivery was at 39 gestational weeks.[facility]  Got baby to +2 station, unsuccessful VAD, baby direct OP. Has anxiety about the delivery, wondering if the vacuum was the right option.  The record review makes it seem as if everything was entirely appropriate and smooth.  Pt reassured and seemed to take comfort.  Anesthesia: epidural. Postpartum course has been uneventful. Baby's course has been uneventful. Baby is feeding by breast and bottle. Bleeding: no bleeding. Bowel function is normal. Bladder function is normal. Patient is sexually active, last time was Monday. . Contraception method is none. Postpartum depression screening: equivocal: 12.  Has hx depression/anxiety.  Took xanax, klonopin, and what sounds like a SSRI in the past. Lives w/mom, but she's not helpful. FOB not either.  "I feel like I;m alone."  Denies SI/HI.   Therapy referral offered and accepted. Not sure if wants meds..   Current outpatient prescriptions:  .  ibuprofen (ADVIL,MOTRIN) 600 MG tablet, Take 1 tablet (600 mg total) by mouth every 6 (six) hours., Disp: 30 tablet, Rfl: 0 .  oxyCODONE (OXY IR/ROXICODONE) 5 MG immediate release tablet, Take 1 tablet (5 mg total) by mouth every 4 (four) hours as needed (pain scale 4-7). (Patient not taking: Reported on 09/04/2015), Disp: 30 tablet, Rfl: 0 .  Prenatal Vit-Fe Fumarate-FA (MULTIVITAMIN-PRENATAL) 27-0.8 MG TABS tablet, Take 1 tablet by mouth daily at 12 noon., Disp: 30 each, Rfl: 11  Review of Systems   Constitutional: Negative for fever and chills Eyes: Negative for visual disturbances Respiratory: Negative for shortness of breath, dyspnea Cardiovascular: Negative for chest pain or palpitations  Gastrointestinal: Negative for vomiting, diarrhea and  constipation Genitourinary: Negative for dysuria and urgency Musculoskeletal: Negative for back pain, joint pain, myalgias  Neurological: Negative for dizziness and headaches   Objective:     Filed Vitals:   09/27/15 0945  BP: 150/90  Pulse: 72   General:  alert, cooperative and no distress   Breasts:  negative  Lungs: clear to auscultation bilaterally  Heart:  regular rate and rhythm  Abdomen: Soft, nontender well healed.    Vulva:  normal  Vagina: normal vagina  Cervix:  closed  Corpus: Well involuted     Rectal Exam: no hemorrhoids        Assessment:    normal postpartum exam. CHTN--had stopped meds after losing 50#; will restart now and do a trial off when baby is 4 months old  rx Norvasc 5mg  qd  Plan:    1. Contraception: Nexplanon 2. Follow up in: 3 weeks for Nexplanon (already ordered).  No sex until then.  3. [facility] referral sent 4. Norvasc 5mg --stop 3-4 days before appt in 3 months to see if she willneed to continue beyond postpartum period

## 2015-09-28 ENCOUNTER — Telehealth: Payer: Self-pay | Admitting: *Deleted

## 2015-09-28 MED ORDER — AMLODIPINE BESYLATE 5 MG PO TABS: 5.0000 mg | ORAL_TABLET | Freq: Every day | ORAL | Status: DC

## 2015-09-28 NOTE — Telephone Encounter (Signed)
Will send order for norvasc

## 2015-10-01 ENCOUNTER — Telehealth: Payer: Self-pay | Admitting: Advanced Practice Midwife

## 2015-10-10 ENCOUNTER — Encounter: Payer: Self-pay | Admitting: Advanced Practice Midwife

## 2015-10-10 ENCOUNTER — Ambulatory Visit (INDEPENDENT_AMBULATORY_CARE_PROVIDER_SITE_OTHER): Payer: Medicaid Other | Admitting: Advanced Practice Midwife

## 2015-10-10 VITALS — BP 166/94 | HR 68 | Ht 65.0 in | Wt 348.0 lb

## 2015-10-10 DIAGNOSIS — Z3202 Encounter for pregnancy test, result negative: Secondary | ICD-10-CM | POA: Diagnosis not present

## 2015-10-10 DIAGNOSIS — Z30017 Encounter for initial prescription of implantable subdermal contraceptive: Secondary | ICD-10-CM

## 2015-10-10 DIAGNOSIS — Z3046 Encounter for surveillance of implantable subdermal contraceptive: Secondary | ICD-10-CM

## 2015-10-10 DIAGNOSIS — Z30019 Encounter for initial prescription of contraceptives, unspecified: Secondary | ICD-10-CM

## 2015-10-10 LAB — POCT URINE PREGNANCY: Preg Test, Ur: NEGATIVE

## 2015-10-10 NOTE — Patient Instructions (Signed)
[facility] (counseling) [phone]

## 2015-10-10 NOTE — Progress Notes (Signed)
  HPI:  Sara Vargas is a 25 y.o. year old Caucasian female here for Nexplanon insertion. (she did not take her bp meds today yet).  She has not had sex since delivery and her pregnancy test today was negative.  Risks/benefits/side effects of Nexplanon have been discussed and her questions have been answered.  Specifically, a failure rate of 05/998 has been reported, with an increased failure rate if pt takes St. John's Wort and/or antiseizure medicaitons.  Sara Vargas is aware of the common side effect of irregular bleeding, which the incidence of decreases over time.  I got a message from [facility] that she has not returned messages. Denies getting any, phone number given to her to call and schedule an appt.   Past Medical History: Past Medical History  Diagnosis Date  . Hypertension     labetolol 200 bid  . Anxiety   . Depression     Past Surgical History: Past Surgical History  Procedure Laterality Date  . Tonsillectomy    . Arm surgery    . Ureteral exploration    . Cesarean section N/A 08/28/2015    Procedure: CESAREAN SECTION;  Surgeon: Tereso NewcomerUgonna A Anyanwu, MD;  Location: [facility];  Service: Obstetrics;  Laterality: N/A;    Family History: Family History  Problem Relation Age of Onset  . Heart disease Mother   . COPD Mother   . Asthma Mother   . Other Father     benign brain tumor  . Heart disease Maternal Grandmother     Social History: Social History  Substance Use Topics  . Smoking status: Former Smoker -- 6.00 packs/day    Types: Cigarettes    Quit date: 03/09/2015  . Smokeless tobacco: Never Used  . Alcohol Use: No    Allergies: No Known Allergies    Her left arm, approximatly 4 inches proximal from the elbow, was cleansed with alcohol and anesthetized with 2cc of 2% Lidocaine.  The area was cleansed again and the Nexplanon was inserted without difficulty.  A pressure bandage was applied.  Pt was instructed to remove pressure bandage in a few  hours, and keep insertion site covered with a bandaid for 3 days.  Back up contraception was recommended for 2 weeks.  F/U in August:  Dc BP meds a few days before appt so we can see if she will need to stay on meds (probably, but pt wants to see).  CRESENZO-DISHMAN,Jihan Mellette 10/10/2015 11:09 AM

## 2015-12-28 ENCOUNTER — Telehealth: Payer: Self-pay | Admitting: *Deleted

## 2015-12-28 ENCOUNTER — Ambulatory Visit: Payer: Medicaid Other | Admitting: Obstetrics & Gynecology

## 2015-12-28 NOTE — Telephone Encounter (Signed)
Pt states her MCD was not active today for her appt needs to r/s for a B/P check. Pt states she has been taking the Norvasc 5 mg daily as Drenda Freeze prescribed. Pt scheduled with Drenda Freeze for 01/04/2016 for B/P check.

## 2016-01-04 ENCOUNTER — Encounter: Payer: Self-pay | Admitting: Advanced Practice Midwife

## 2016-01-04 ENCOUNTER — Ambulatory Visit (INDEPENDENT_AMBULATORY_CARE_PROVIDER_SITE_OTHER): Payer: Medicaid Other | Admitting: Advanced Practice Midwife

## 2016-01-04 VITALS — BP 138/70 | HR 88 | Ht 65.0 in | Wt 344.0 lb

## 2016-01-04 DIAGNOSIS — I10 Essential (primary) hypertension: Secondary | ICD-10-CM

## 2016-01-04 MED ORDER — PRENATAL 27-0.8 MG PO TABS: 1.0000 | ORAL_TABLET | Freq: Every day | ORAL | 11 refills | Status: DC

## 2016-01-04 MED ORDER — AMLODIPINE BESYLATE 5 MG PO TABS: 5.0000 mg | ORAL_TABLET | Freq: Every day | ORAL | 6 refills | Status: AC

## 2016-01-04 NOTE — Progress Notes (Signed)
[facility] Visit  Patient name: Sara Vargas MRN [id number]  Date of birth: 12/30/1990  CC & HPI:  Sara Vargas is a 25 y.o. Caucasian female presenting today for BP check,  She was placed on Norvasc after delivery (had been on meds at one time, didn't need them until end of pregnancy).  Still spots on Nexplanon, but it's only been 5months  Will give ititime.   Pertinent History Reviewed:  Medical & Surgical Hx:   Past Medical History:  Diagnosis Date  . Anxiety   . Depression   . Hypertension    labetolol 200 bid   Past Surgical History:  Procedure Laterality Date  . arm surgery    . CESAREAN SECTION N/A 08/28/2015   Procedure: CESAREAN SECTION;  Surgeon: Tereso NewcomerUgonna A Anyanwu, MD;  Location: [facility];  Service: Obstetrics;  Laterality: N/A;  . TONSILLECTOMY    . URETERAL EXPLORATION     Family History  Problem Relation Age of Onset  . Heart disease Mother   . COPD Mother   . Asthma Mother   . Other Father     benign brain tumor  . Heart disease Maternal Grandmother     Current Outpatient Prescriptions:  .  amLODipine (NORVASC) 5 MG tablet, Take 1 tablet (5 mg total) by mouth daily., Disp: 30 tablet, Rfl: 6 .  Prenatal Vit-Fe Fumarate-FA (MULTIVITAMIN-PRENATAL) 27-0.8 MG TABS tablet, Take 1 tablet by mouth daily at 12 noon., Disp: 30 each, Rfl: 11 Social History: Reviewed -  reports that she has been smoking Cigarettes.  She has a 5.00 pack-year smoking history. She has never used smokeless tobacco.  Review of Systems:   Constitutional: Negative for fever and chills Eyes: Negative for visual disturbances Respiratory: Negative for shortness of breath, dyspnea Cardiovascular: Negative for chest pain or palpitations  Gastrointestinal: Negative for vomiting, diarrhea and constipation; no abdominal pain Genitourinary: Negative for dysuria and urgency, vaginal irritation or itching Musculoskeletal: Negative for back pain, joint pain, myalgias  Neurological: Negative  for dizziness and headaches    Objective Findings:    Physical Examination: General appearance - well appearing, and in no distress Mental status - alert, oriented to person, place, and time Chest:  Normal respiratory effort Heart - normal rate and regular rhythm Abdomen:  Soft, nontender Musculoskeletal:  Normal range of motion without pain Extremities:  No edema    No results found for this or any previous visit (from the past 24 hour(s)).    Assessment & Plan:  A:   CHTN, controlled P:  Continue Norvasc 5 mg Daily   Return if symptoms worsen or fail to improve.  CRESENZO-DISHMAN,Drue Camera CNM 01/04/2016 1:21 PM

## 2016-05-02 ENCOUNTER — Other Ambulatory Visit: Payer: Self-pay | Admitting: Women's Health

## 2016-08-12 ENCOUNTER — Emergency Department (HOSPITAL_COMMUNITY)
Admission: EM | Admit: 2016-08-12 | Discharge: 2016-08-13 | Disposition: A | Discharge status: Home / Self Care | Payer: Medicaid Other | Attending: Emergency Medicine | Admitting: Emergency Medicine

## 2016-08-12 ENCOUNTER — Encounter (HOSPITAL_COMMUNITY): Payer: Self-pay | Admitting: Emergency Medicine

## 2016-08-12 ENCOUNTER — Emergency Department (HOSPITAL_COMMUNITY): Payer: Medicaid Other

## 2016-08-12 DIAGNOSIS — M5442 Lumbago with sciatica, left side: Secondary | ICD-10-CM | POA: Diagnosis not present

## 2016-08-12 DIAGNOSIS — I10 Essential (primary) hypertension: Secondary | ICD-10-CM | POA: Diagnosis not present

## 2016-08-12 DIAGNOSIS — M5441 Lumbago with sciatica, right side: Secondary | ICD-10-CM | POA: Insufficient documentation

## 2016-08-12 DIAGNOSIS — Z79899 Other long term (current) drug therapy: Secondary | ICD-10-CM | POA: Diagnosis not present

## 2016-08-12 DIAGNOSIS — F17210 Nicotine dependence, cigarettes, uncomplicated: Secondary | ICD-10-CM | POA: Diagnosis not present

## 2016-08-12 DIAGNOSIS — M545 Low back pain: Secondary | ICD-10-CM | POA: Diagnosis present

## 2016-08-12 LAB — POC URINE PREG, ED: PREG TEST UR: NEGATIVE

## 2016-08-12 MED ORDER — KETOROLAC TROMETHAMINE 60 MG/2ML IM SOLN
60.0000 mg | Freq: Once | INTRAMUSCULAR | Status: AC
  Administered 2016-08-12: 60 mg via INTRAMUSCULAR
  Filled 2016-08-12: qty 2

## 2016-08-12 MED ORDER — HYDROCODONE-ACETAMINOPHEN 5-325 MG PO TABS
1.0000 | ORAL_TABLET | Freq: Once | ORAL | Status: AC
  Administered 2016-08-12: 1 via ORAL
  Filled 2016-08-12: qty 1

## 2016-08-12 NOTE — ED Triage Notes (Signed)
Pain in lower back started today while bending down, thinks she hears something pop.

## 2016-08-13 MED ORDER — CYCLOBENZAPRINE HCL 10 MG PO TABS
10.0000 mg | ORAL_TABLET | Freq: Once | ORAL | Status: AC
  Administered 2016-08-13: 10 mg via ORAL
  Filled 2016-08-13: qty 1

## 2016-08-13 MED ORDER — CYCLOBENZAPRINE HCL 5 MG PO TABS: 5.0000 mg | ORAL_TABLET | Freq: Three times a day (TID) | ORAL | 0 refills | Status: DC | PRN

## 2016-08-13 MED ORDER — PREDNISONE 10 MG PO TABS: ORAL_TABLET | ORAL | 0 refills | Status: DC

## 2016-08-13 MED ORDER — PREDNISONE 50 MG PO TABS
60.0000 mg | ORAL_TABLET | Freq: Once | ORAL | Status: AC
  Administered 2016-08-13: 60 mg via ORAL
  Filled 2016-08-13: qty 1

## 2016-08-13 MED ORDER — HYDROCODONE-ACETAMINOPHEN 5-325 MG PO TABS: 1.0000 | ORAL_TABLET | Freq: Four times a day (QID) | ORAL | 0 refills | Status: DC | PRN

## 2016-08-13 NOTE — Discharge Instructions (Signed)
Take your next dose of prednisone tomorrow evening.  Use the the other medicines as directed.  Do not drive within 4 hours of taking hydrocodone as this will make you drowsy.  Avoid lifting,  Bending,  Twisting or any other activity that worsens your pain over the next week.  Apply an  icepack  to your lower back for 10-15 minutes every 2 hours for the next 2 days.  You should get rechecked if your symptoms are not better over the next 5 days,  Or you develop increased pain,  Weakness in your leg(s) or loss of bladder or bowel function - these are symptoms of a worse injury. ° °

## 2016-08-13 NOTE — ED Provider Notes (Signed)
[facility]PT Provider Note   CSN: [id number] Arrival date & time: 08/12/16  1908     History   Chief Complaint Chief Complaint  Patient presents with  . Back Pain    HPI Sara Vargas is a 26 y.o. female with no prior history of back pain felt sudden onset of pain with a popping sensation when she bend down to pick up a light object.  She reports radiation of pain into her bilateral toes with the event but with now improvement of this radiating pain.  She denies weakness in the legs and has been ambulatory since, stating walking although painful is more relieving of pain than sitting.  She has urinated since the event and denies any loss of bladder or bowel control.  She has had no medicines prior to arriving here.     .The history is provided by the patient.    Past Medical History:  Diagnosis Date  . Anxiety   . Depression   . Hypertension    labetolol 200 bid    Patient Active Problem List   Diagnosis Date Noted  . Nexplanon insertion 10/10/2015  . Status post primary low transverse cesarean section 08/29/2015  . Morbid obesity with BMI of 50.0-59.9, adult (HCC) 01/18/2015  . Chronic hypertension during pregnancy, antepartum   . ABNORMAL WEIGHT GAIN 04/13/2007    Past Surgical History:  Procedure Laterality Date  . arm surgery    . CESAREAN SECTION N/A 08/28/2015   Procedure: CESAREAN SECTION;  Surgeon: Tereso NewcomerUgonna A Anyanwu, MD;  Location: [facility];  Service: Obstetrics;  Laterality: N/A;  . TONSILLECTOMY    . URETERAL EXPLORATION      OB History    Gravida Para Term Preterm AB Living   1 1 1     1    SAB TAB Ectopic Multiple Live Births         0 1       Home Medications    Prior to Admission medications   Medication Sig Start Date End Date Taking? Authorizing Provider  amLODipine (NORVASC) 5 MG tablet Take 1 tablet (5 mg total) by mouth daily. Patient taking differently: Take 5 mg by mouth at bedtime.  01/04/16  Yes Jacklyn ShellFrances Cresenzo-Dishmon, CNM    Prenatal Vit-Fe Fumarate-FA (MULTIVITAMIN-PRENATAL) 27-0.8 MG TABS tablet Take 1 tablet by mouth daily at 12 noon. Patient taking differently: Take 1 tablet by mouth at bedtime.  01/04/16  Yes Jacklyn ShellFrances Cresenzo-Dishmon, CNM  venlafaxine XR (EFFEXOR-XR) 37.5 MG 24 hr capsule Take 37.5 mg by mouth daily with breakfast.   Yes Historical Provider, MD  cyclobenzaprine (FLEXERIL) 5 MG tablet Take 1 tablet (5 mg total) by mouth 3 (three) times daily as needed for muscle spasms. 08/13/16   Burgess AmorJulie Eulogio Requena, PA-C  HYDROcodone-acetaminophen (NORCO/VICODIN) 5-325 MG tablet Take 1 tablet by mouth every 6 (six) hours as needed for severe pain. 08/13/16   Burgess AmorJulie Blanch Stang, PA-C  predniSONE (DELTASONE) 10 MG tablet Take 6 tablets day one, 5 tablets day two, 4 tablets day three, 3 tablets day four, 2 tablets day five, then 1 tablet day six 08/13/16   Burgess AmorJulie Naseer Hearn, PA-C    Family History Family History  Problem Relation Age of Onset  . Heart disease Mother   . COPD Mother   . Asthma Mother   . Other Father     benign brain tumor  . Heart disease Maternal Grandmother     Social History Social History  Substance Use Topics  . Smoking status:  Current Every Day Smoker    Packs/day: 1.00    Years: 10.00    Types: Cigarettes    Last attempt to quit: 03/09/2015  . Smokeless tobacco: Never Used  . Alcohol use No     Allergies   Patient has no known allergies.   Review of Systems Review of Systems  Constitutional: Negative for fever.  Respiratory: Negative for shortness of breath.   Cardiovascular: Negative for chest pain and leg swelling.  Gastrointestinal: Negative for abdominal distention, abdominal pain and constipation.  Genitourinary: Negative for difficulty urinating, dysuria, flank pain, frequency and urgency.  Musculoskeletal: Positive for back pain. Negative for gait problem and joint swelling.  Skin: Negative for rash.  Neurological: Negative for weakness and numbness.     Physical Exam Updated  Vital Signs BP 133/70 (BP Location: Left Arm)   Pulse 71   Temp 99 F (37.2 C) (Oral)   Resp 20   Ht 5\' 5"  (1.651 m)   Wt (!) 154.2 kg   LMP 08/10/2016   SpO2 100%   BMI 56.58 kg/m   Physical Exam  Constitutional: She appears well-developed and well-nourished.  HENT:  Head: Normocephalic.  Eyes: Conjunctivae are normal.  Neck: Normal range of motion. Neck supple.  Cardiovascular: Normal rate and intact distal pulses.   Pedal pulses normal.  Pulmonary/Chest: Effort normal.  Abdominal: Soft. Bowel sounds are normal. She exhibits no distension and no mass.  Musculoskeletal: Normal range of motion. She exhibits no edema.       Lumbar back: She exhibits tenderness. She exhibits no swelling, no edema, no deformity and no spasm.  Midline lumbar and bilateral paralumbar tenderness.  Neurological: She is alert. She has normal strength. She displays no atrophy and no tremor. No sensory deficit. Gait normal.  Reflex Scores:      Patellar reflexes are 2+ on the right side and 2+ on the left side.      Achilles reflexes are 2+ on the right side and 2+ on the left side. No strength deficit noted in hip and knee flexor and extensor muscle groups.  Ankle flexion and extension intact.  Skin: Skin is warm and dry.  Psychiatric: She has a normal mood and affect.  Nursing note and vitals reviewed.    ED Treatments / Results  Labs (all labs ordered are listed, but only abnormal results are displayed) Labs Reviewed  POC URINE PREG, ED    EKG  EKG Interpretation None       Radiology Ct Lumbar Spine Wo Contrast  Result Date: 08/12/2016 CLINICAL DATA:  Acute onset of lower back while bending down. Initial encounter. EXAM: CT LUMBAR SPINE WITHOUT CONTRAST TECHNIQUE: Multidetector CT imaging of the lumbar spine was performed without intravenous contrast administration. Multiplanar CT image reconstructions were also generated. COMPARISON:  CT of the abdomen and pelvis from 12/09/2009  FINDINGS: Segmentation: 5 lumbar type vertebrae. Alignment: Normal. Vertebrae: No acute fracture or focal pathologic process. Paraspinal and other soft tissues: The paraspinal musculature is unremarkable in appearance. The visualized portions of the abdomen and pelvis are grossly unremarkable. Disc levels: Intervertebral disc spaces are preserved. Bony foraminal narrowing is noted on the left side at L5-S1 due to an osteophyte. IMPRESSION: 1. No evidence of fracture or subluxation along the lumbar spine. 2. Left-sided bony foraminal narrowing at L5-S1 due to an osteophyte. Electronically Signed   By: Roanna Raider M.D.   On: 08/12/2016 23:54    Procedures Procedures (including critical care time)  Medications Ordered in  ED Medications  ketorolac (TORADOL) injection 60 mg (60 mg Intramuscular Given 08/12/16 2357)  HYDROcodone-acetaminophen (NORCO/VICODIN) 5-325 MG per tablet 1 tablet (1 tablet Oral Given 08/12/16 2357)  predniSONE (DELTASONE) tablet 60 mg (60 mg Oral Given 08/13/16 0117)  cyclobenzaprine (FLEXERIL) tablet 10 mg (10 mg Oral Given 08/13/16 0117)     Initial Impression / Assessment and Plan / ED Course  I have reviewed the triage vital signs and the nursing notes.  Pertinent labs & imaging results that were available during my care of the patient were reviewed by me and considered in my medical decision making (see chart for details).     No neuro deficit on exam or by history to suggest emergent or surgical presentation.  discussed worsened sx that should prompt immediate re-evaluation including distal weakness, bowel/bladder retention/incontinence. Discussed osteophyte finding.  Advised f/u with pcp in one week if not improving with todays tx.        Final Clinical Impressions(s) / ED Diagnoses   Final diagnoses:  Acute bilateral low back pain with bilateral sciatica    New Prescriptions Discharge Medication List as of 08/13/2016 12:39 AM    START taking these  medications   Details  cyclobenzaprine (FLEXERIL) 5 MG tablet Take 1 tablet (5 mg total) by mouth 3 (three) times daily as needed for muscle spasms., Starting Wed 08/13/2016, Print    HYDROcodone-acetaminophen (NORCO/VICODIN) 5-325 MG tablet Take 1 tablet by mouth every 6 (six) hours as needed for severe pain., Starting Wed 08/13/2016, Print    predniSONE (DELTASONE) 10 MG tablet Take 6 tablets day one, 5 tablets day two, 4 tablets day three, 3 tablets day four, 2 tablets day five, then 1 tablet day six, Print         Burgess Amor, PA-C 08/14/16 1432    Donnetta Hutching, MD 08/16/16 [phone]

## 2016-11-28 IMAGING — CR DG CHEST 2V
2 series · 2 of 2 positions shown · non-contrast
Comparison: August 21, 2009

CLINICAL DATA: Chest pain and difficulty breathing

EXAM:
CHEST  2 VIEW

[w chest pa]
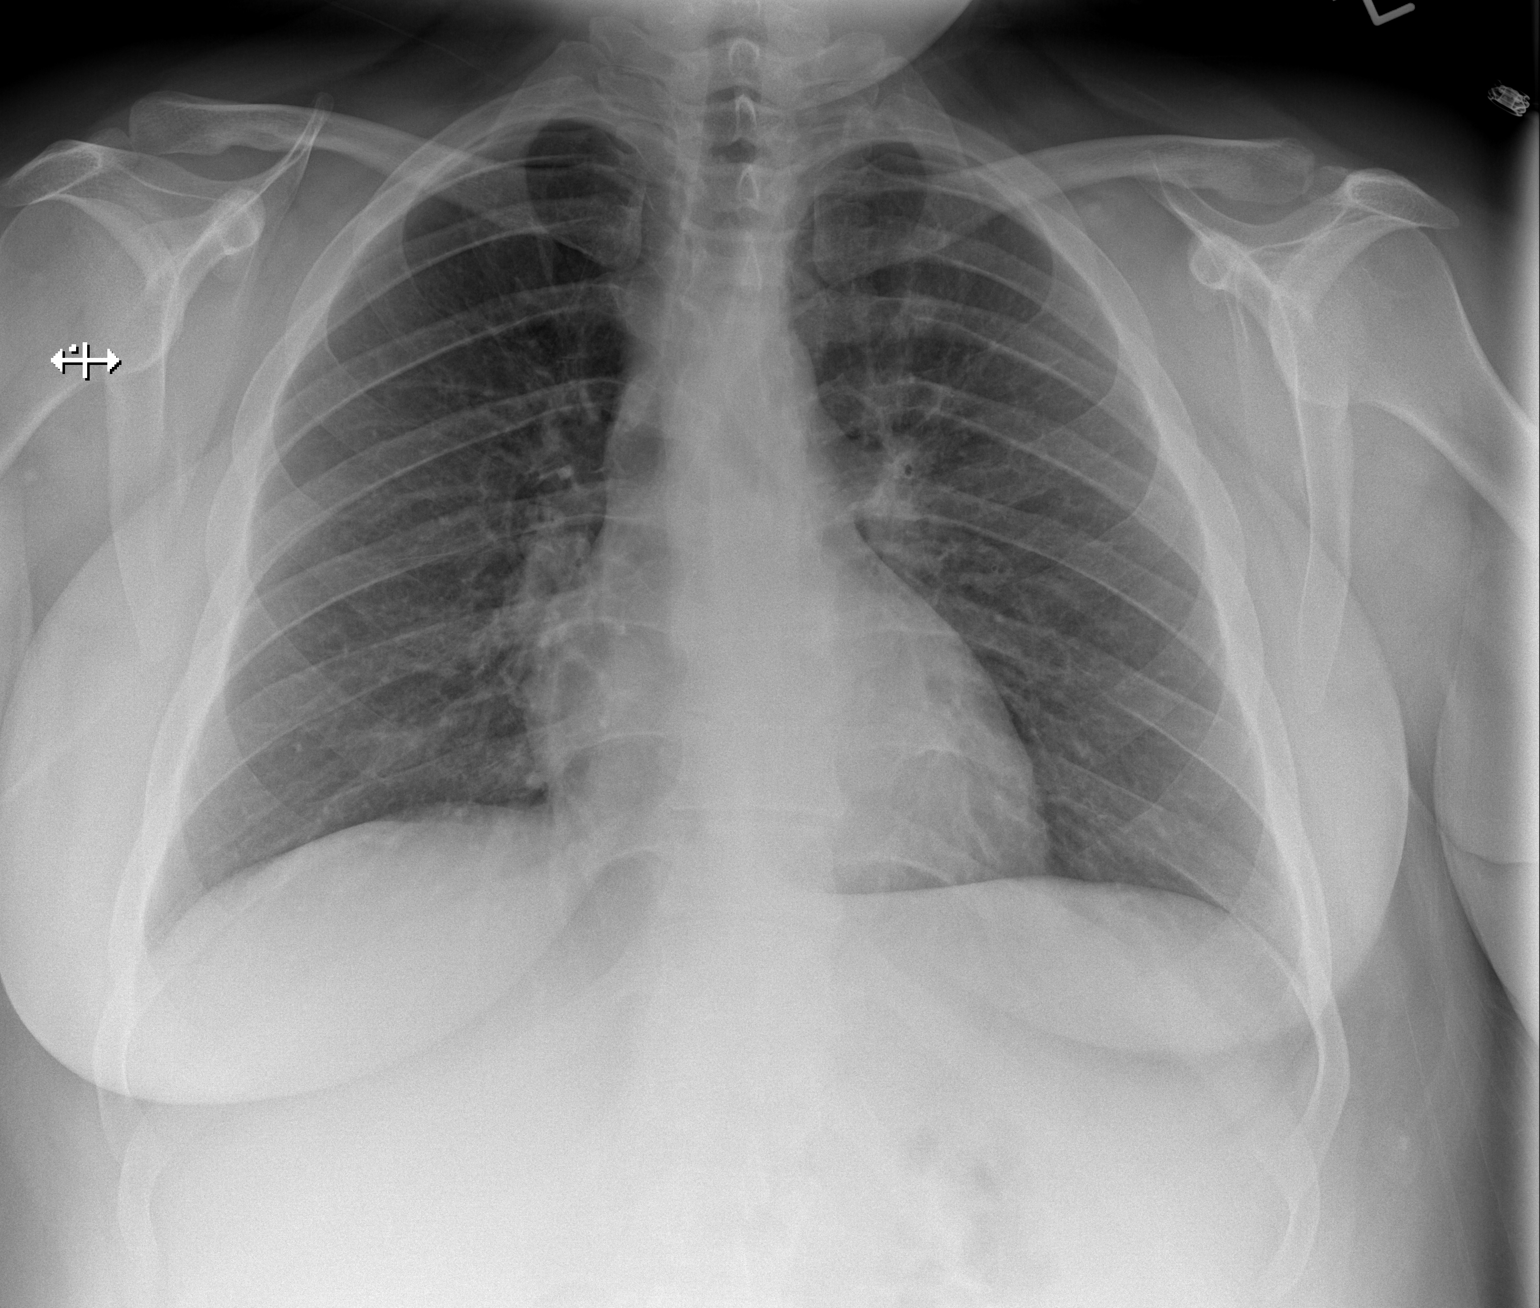

[w chest lat]
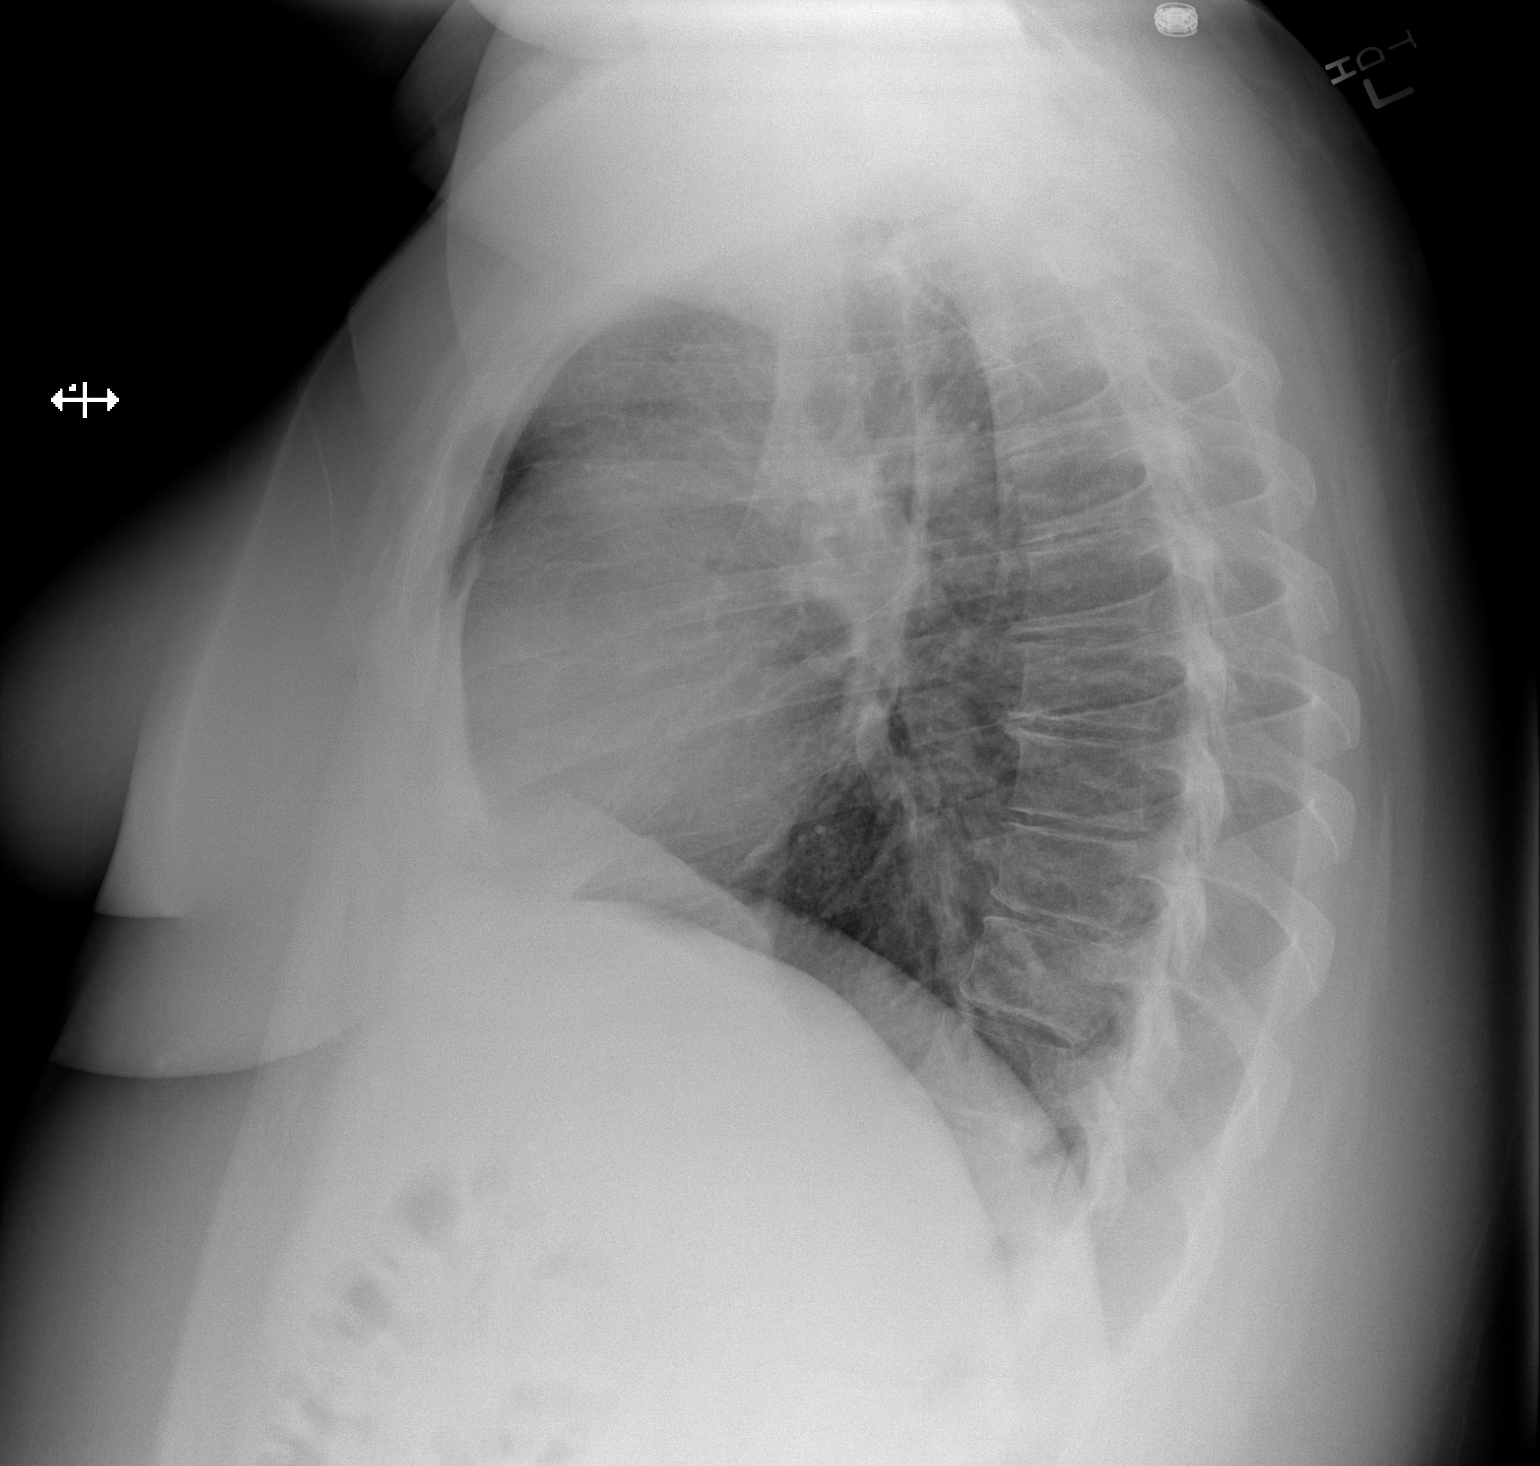

[2 of 2 positions shown; findings below may reference images not displayed]

FINDINGS: Lungs are clear. Heart size and pulmonary vascularity are normal. No
adenopathy. No pneumothorax. No bone lesions.
IMPRESSION: No edema or consolidation.

## 2017-07-30 ENCOUNTER — Other Ambulatory Visit: Payer: Self-pay | Admitting: Family Medicine

## 2017-07-30 DIAGNOSIS — N643 Galactorrhea not associated with childbirth: Secondary | ICD-10-CM

## 2017-08-03 ENCOUNTER — Other Ambulatory Visit: Payer: Self-pay | Admitting: Family Medicine

## 2017-08-03 DIAGNOSIS — N643 Galactorrhea not associated with childbirth: Secondary | ICD-10-CM

## 2017-08-06 ENCOUNTER — Ambulatory Visit
Admission: RE | Admit: 2017-08-06 | Discharge: 2017-08-06 | Disposition: A | Discharge status: Home / Self Care | Payer: Medicaid Other | Source: Ambulatory Visit | Attending: Family Medicine | Admitting: Family Medicine

## 2017-08-06 DIAGNOSIS — N643 Galactorrhea not associated with childbirth: Secondary | ICD-10-CM

## 2017-09-16 IMAGING — US US MFM OB DETAIL+14 WK
1 series · 14 of 28 positions shown · non-contrast
Comparison: none

[Series 1: us mfm ob detail+14 wk · 119 acquisitions, 14 frames shown]
[im 5/119]
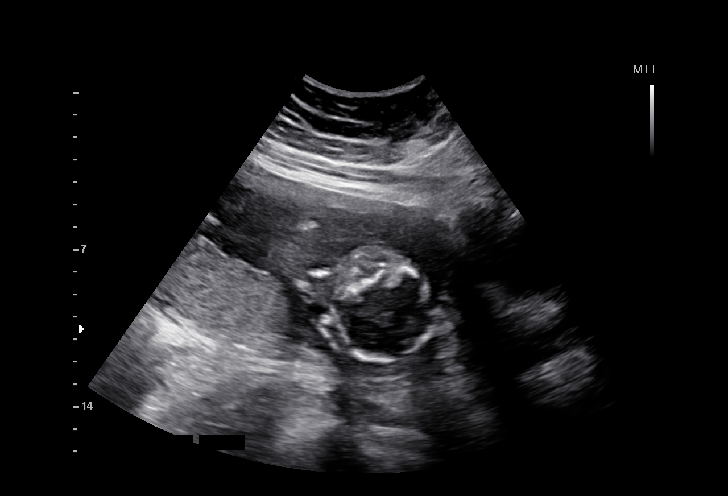
[im 14/119]
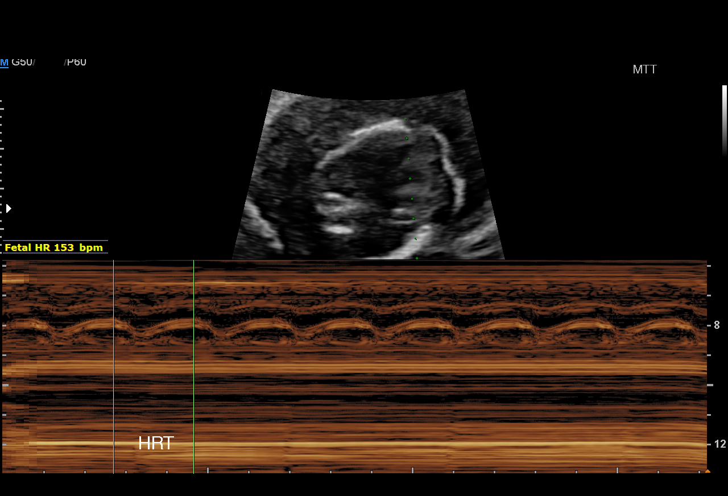
[im 22/119]
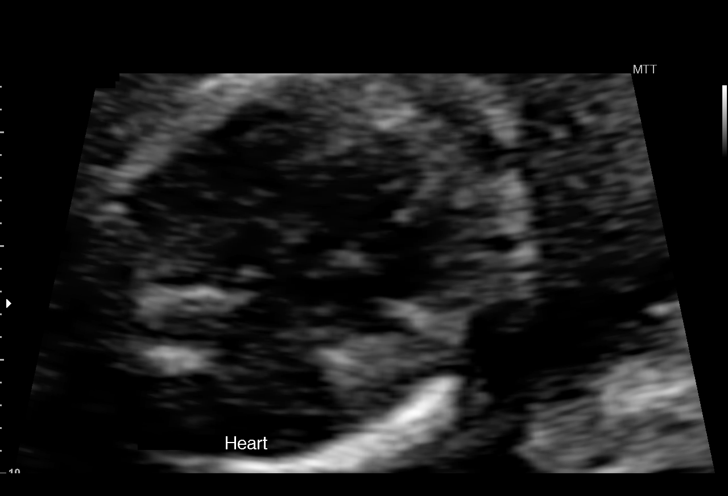
[im 31/119]
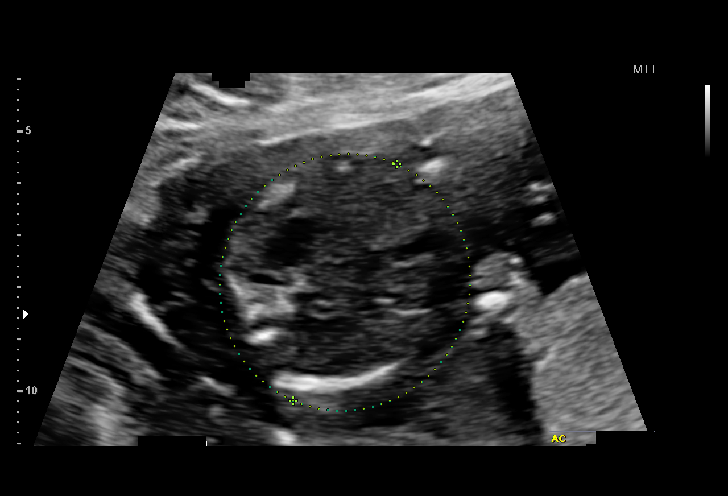
[im 40/119]
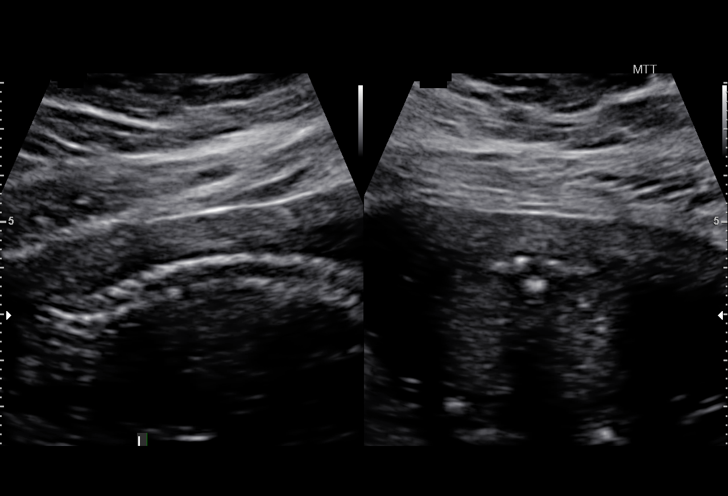
[im 49/119]
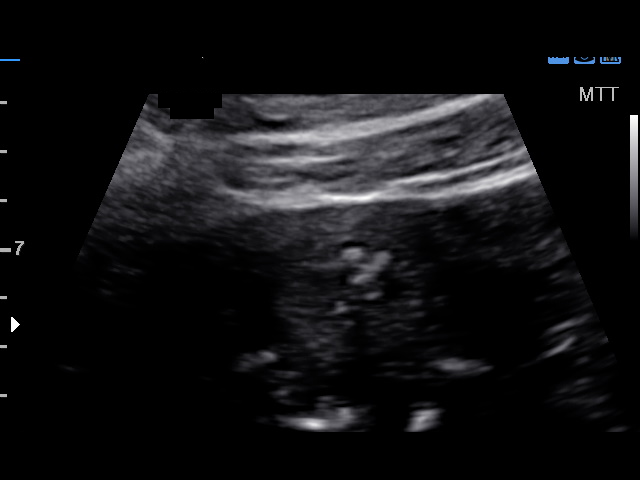
[im 57/119]
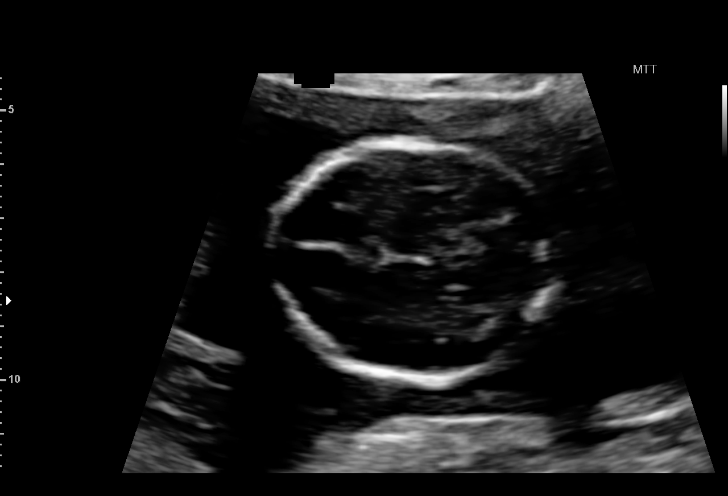
[im 66/119]
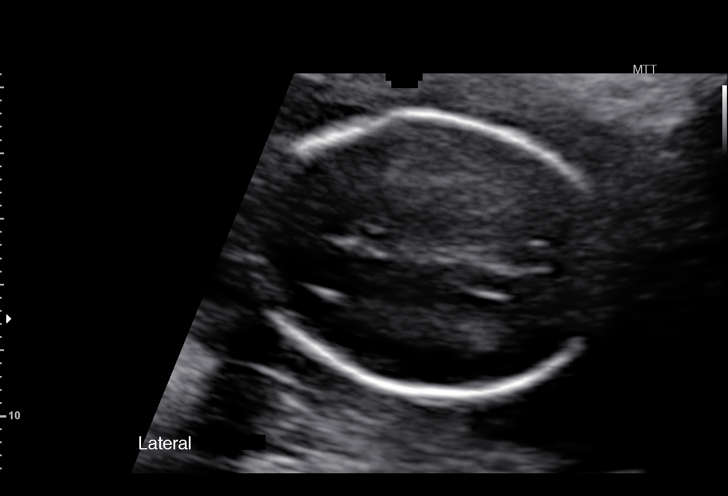
[im 75/119]
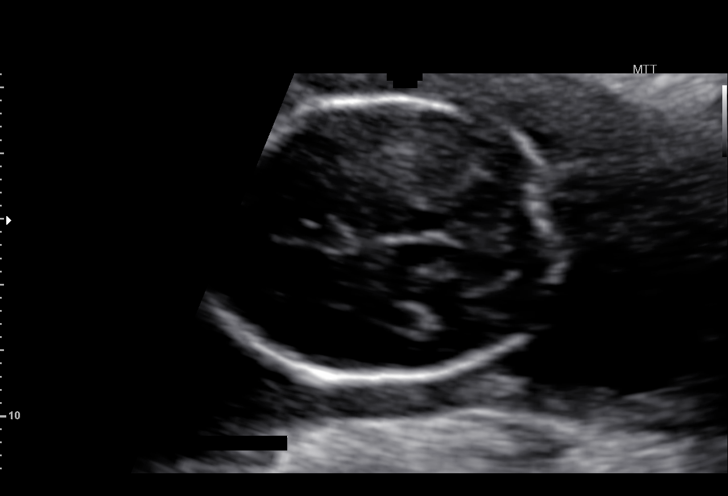
[im 84/119]
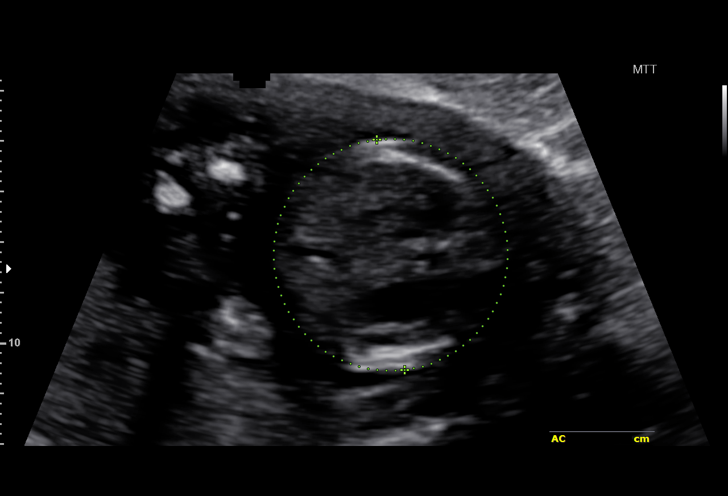
[im 92/119]
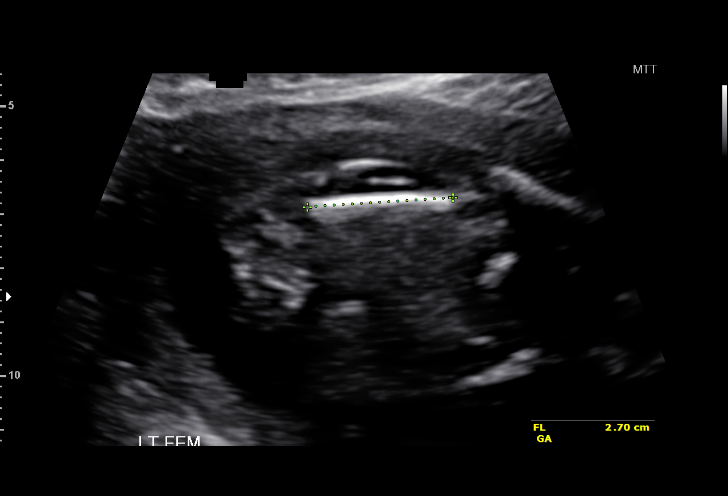
[im 101/119]
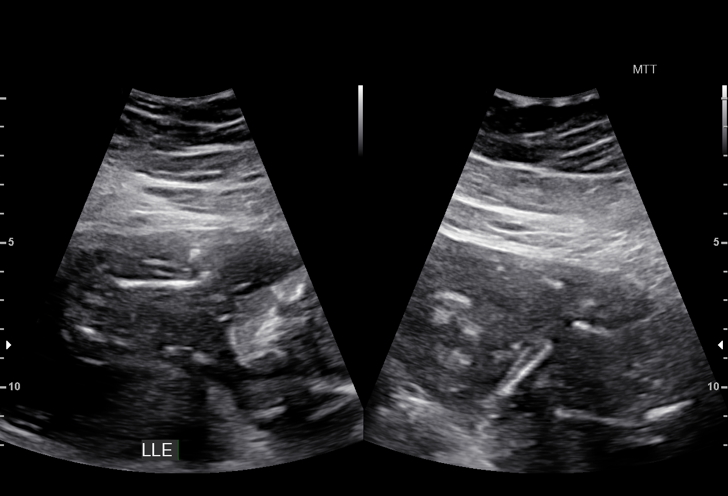
[im 110/119]
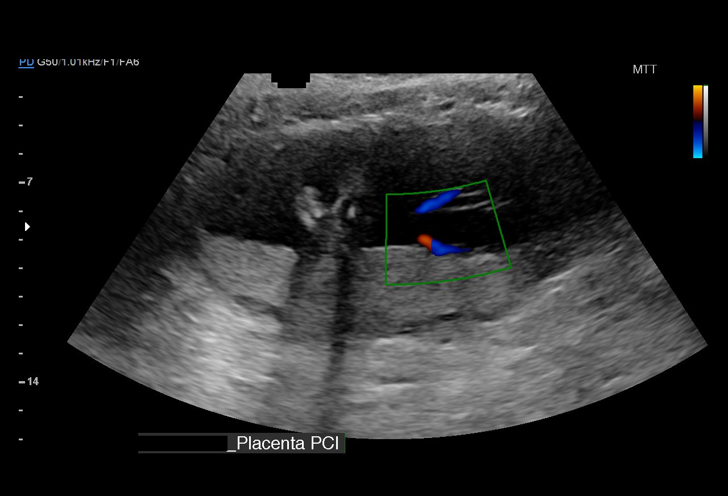
[im 119/119]
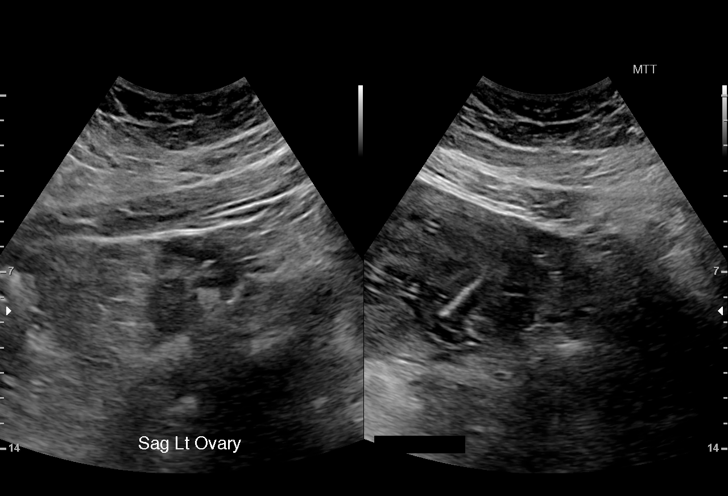

[14 of 28 positions shown; findings below may reference images not displayed]

OBSTETRICS REPORT
(Signed Final 04/12/2015 [DATE])

Name:       BNASR JIBOU                       Visit  04/12/2015 [DATE]
Date:

Service(s) Provided

Indications

19 weeks gestation of pregnancy
Abnormal biochemical finding on antenatal
screening of mother
Detailed fetal anatomic survey                         Z36
Obesity complicating pregnancy, second
trimester
Fetal Evaluation

Num Of             1
Fetuses:
Fetal Heart        153                          bpm
Rate:
Cardiac Activity:  Observed
Presentation:      Variable
Placenta:          Posterior, above cervical
os
P. Cord            Visualized, central
Insertion:

Amniotic Fluid
AFI FV:      Subjectively low-normal
Larg Pckt:    4.94   cm
Biometry

BPD:     42.4   m    G. Age:   18w 6d                 CI:        71.79   70 - 86
m
FL/HC:      17.0   16.1 -
18.3
HC:     159.3   m    G. Age:   18w 6d        16  %    HC/AC:      1.06   1.09 -
m
AC:     150.1   m    G. Age:   20w 1d        72  %    FL/BPD
m                                     :
FL:      27.1   m    G. Age:   18w 2d        10  %    FL/AC:      18.1   20 - 24
m
HUM:     27.2   m    G. Age:   18w 5d        31  %
m
CER:     18.9   m    G. Age:   18w 3d        23  %
m
NFT:       3.5  m
m

Est.         283   gm   0 lb 10 oz      43   %
FW:
Gestational Age

LMP:           25w 1d        Date:  10/18/14                  EDD:   07/25/15
U/S Today:     19w 0d                                         EDD:   09/06/15
Best:          19w 3d    Det. By:   Early Ultrasound          EDD:   09/03/15
(01/09/15)
Anatomy

Cranium:          Appears normal         Aortic Arch:       Not well visualized
Fetal Cavum:      Appears normal         Ductal Arch:       Not well visualized
Ventricles:       Appears normal         Diaphragm:         Appears normal
Choroid Plexus:   Appears normal         Stomach:           Appears normal,
left sided
Cerebellum:       Appears normal         Abdomen:           Appears normal
Posterior         Appears normal         Abdominal          Appears nml (cord
Fossa:                                   Wall:              insert, abd wall)
Nuchal Fold:      Appears normal         Cord Vessels:      Appears normal (3
vessel cord)
Face:             Appears normal         Kidneys:           Appear normal
(orbits and profile)
Lips:             Not well visualized    Bladder:           Appears normal
Heart:            Not well visualized    Spine:             Appears normal
RVOT:             Not well visualized    Lower              Visualized
Extremities:
LVOT:             Not well visualized    Upper              Visualized
Extremities:

Other:   Fetus appears to be a female. Nasal bone visualized. Technically
difficult due to maternal habitus and fetal position.
Targeted Anatomy

Fetal Central Nervous System
Lat. Ventricles:  6.6                    Cisterna
Magna:
Cervix Uterus Adnexa

Cervical Length:    3.62      cm

Cervix:       Normal appearance by transabdominal scan.
Uterus:       No abnormality visualized.
Cul De Sac:   No free fluid seen.

Left Ovary:    Size(cm) L: 2.36 x W: 2.5 x H: 2.3  Volume(cc):
7.1 Within normal limits.
Right Ovary:   Size(cm) L: 3.71 x W: 2.38 x H: 2.13  Volume(cc):
9.8 Within normal limits.
Adnexa:     No adnexal mass visualized.
Impression

Single IUP at 19w 0d
Elevated MSAFP (2.7 MoM)
The fetal spine, cerebellum and posterior fossa appear
normal
Limited views of the fetal heart and face were obtained
Fetal growth is appropriate (43rd %tile)
Normal amniotic fluid volume
Recommendations

See separate note from Genetics counselor
Recommend follow up in 4 weeks to reevaluate the fetal
heart / face

## 2017-10-15 IMAGING — US US MFM OB FOLLOW-UP
1 series · 14 of 28 positions shown · non-contrast
Comparison: none

[Series 1: us mfm ob follow-up · 46 acquisitions, 14 frames shown]
[im 2/46]
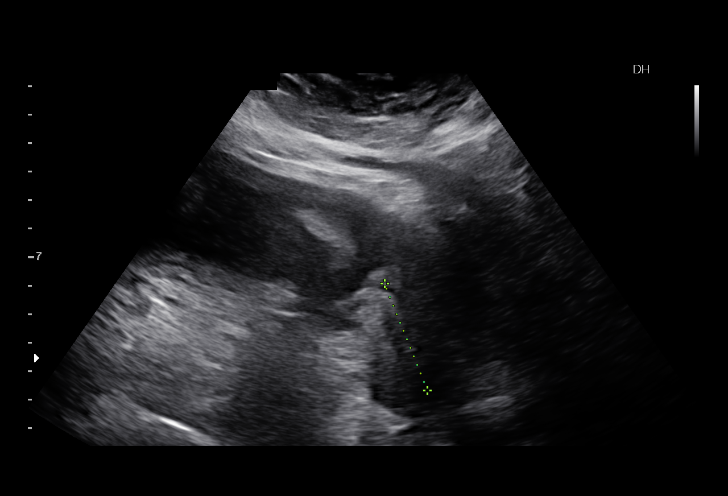
[im 6/46]
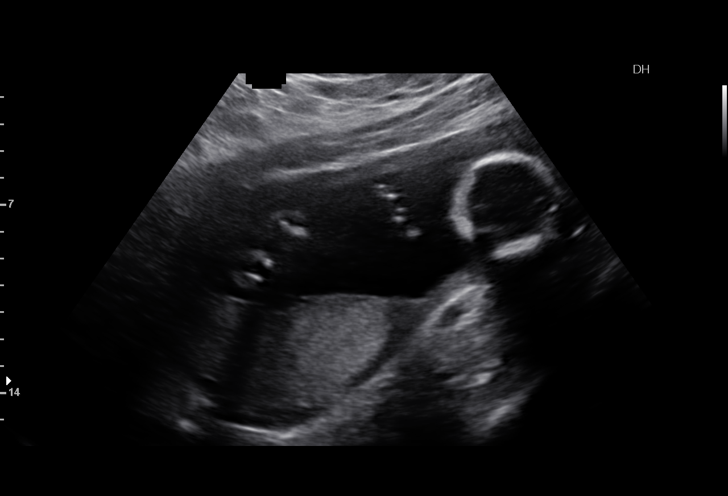
[im 9/46]
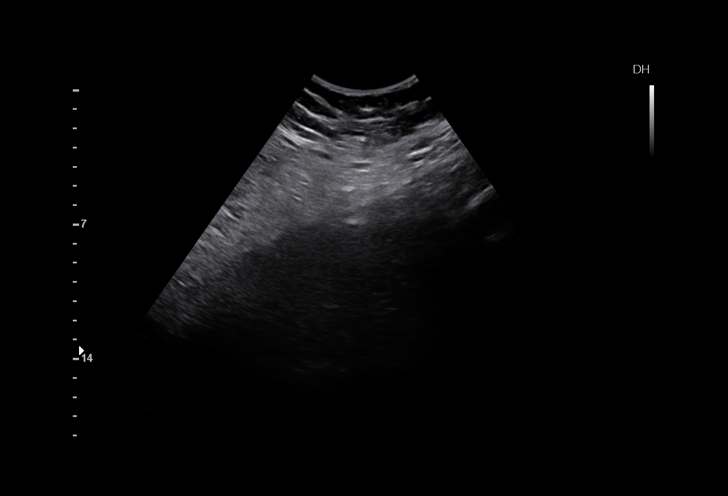
[im 12/46]
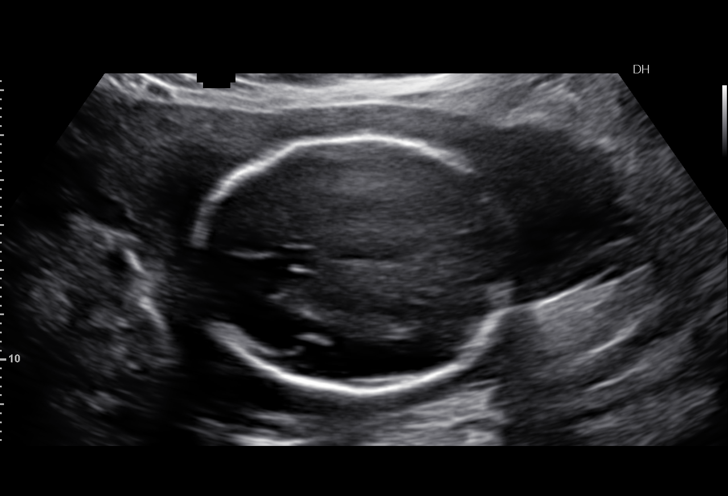
[im 16/46]
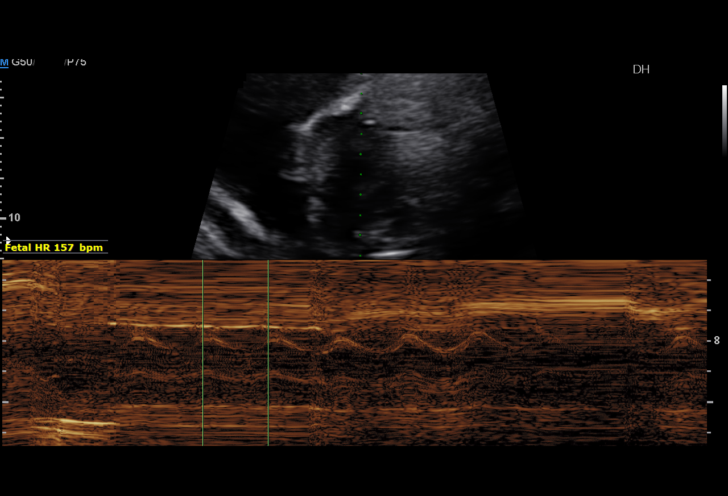
[im 19/46]
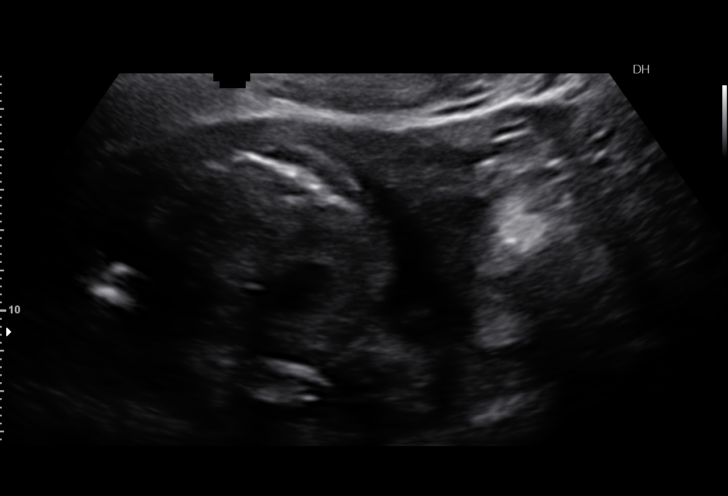
[im 22/46]
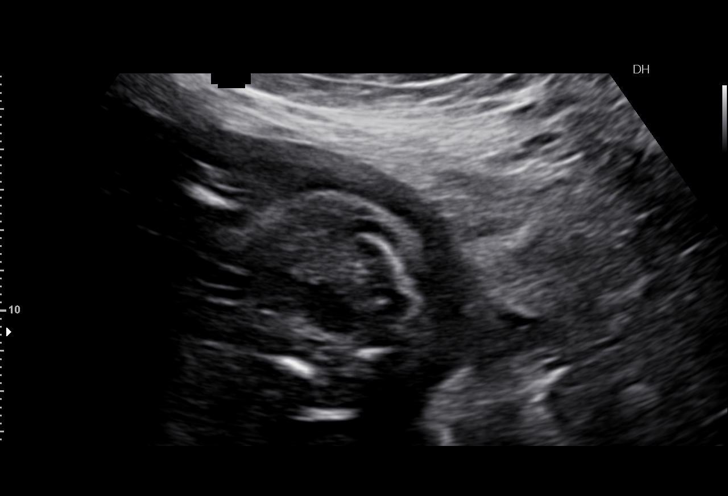
[im 26/46]
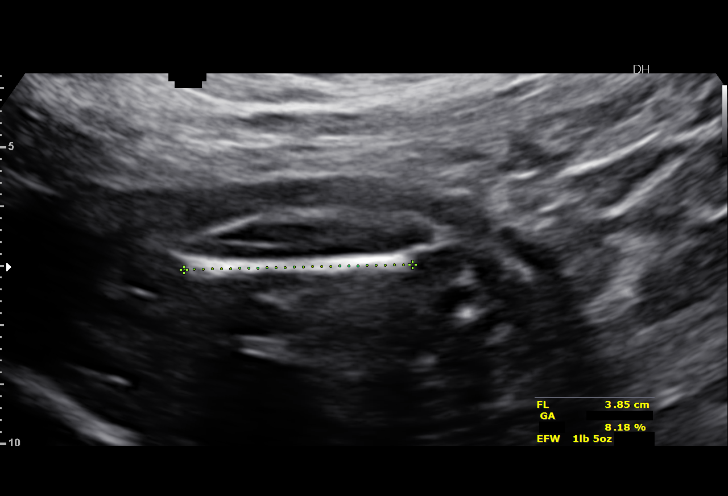
[im 29/46]
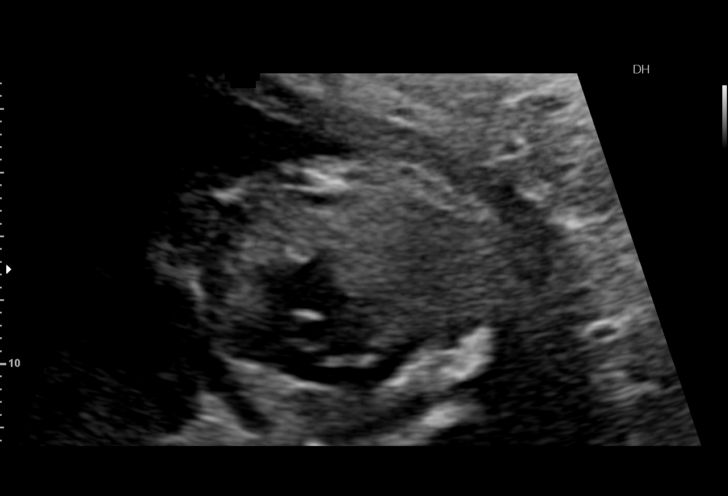
[im 32/46]
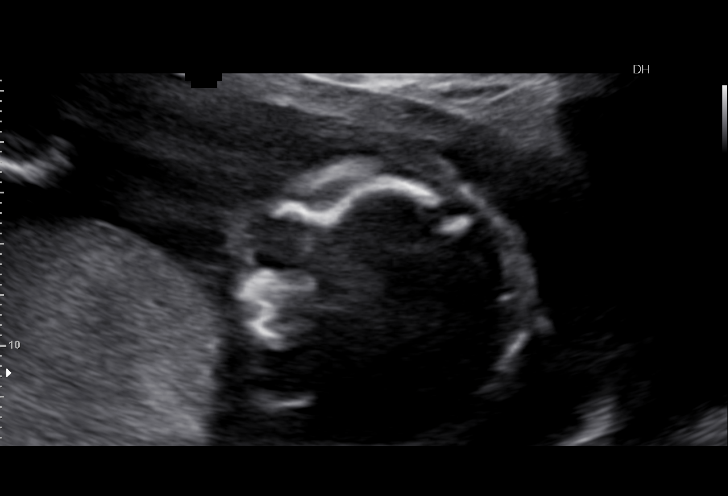
[im 36/46]
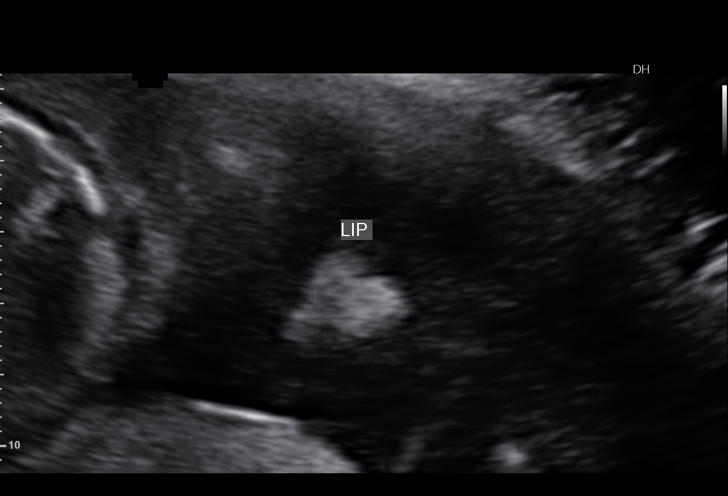
[im 39/46]
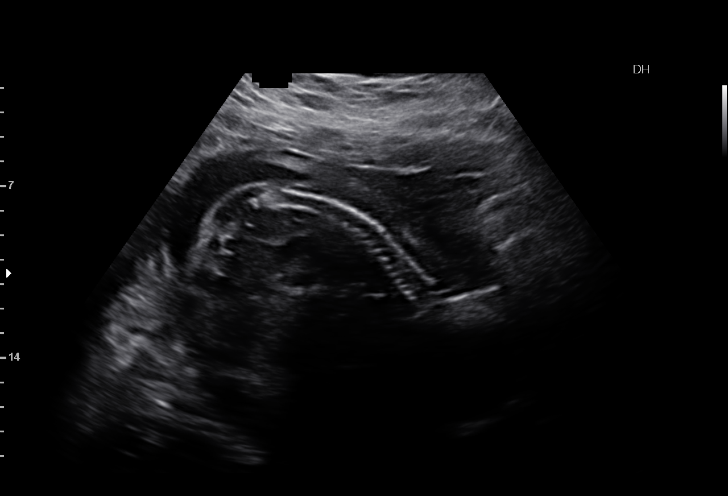
[im 42/46]
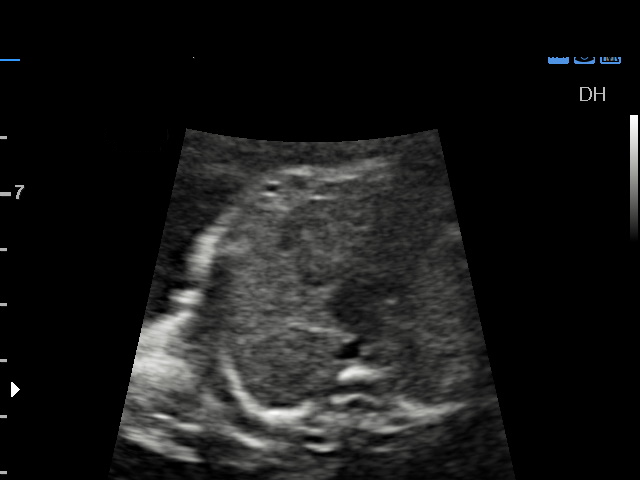
[im 46/46]
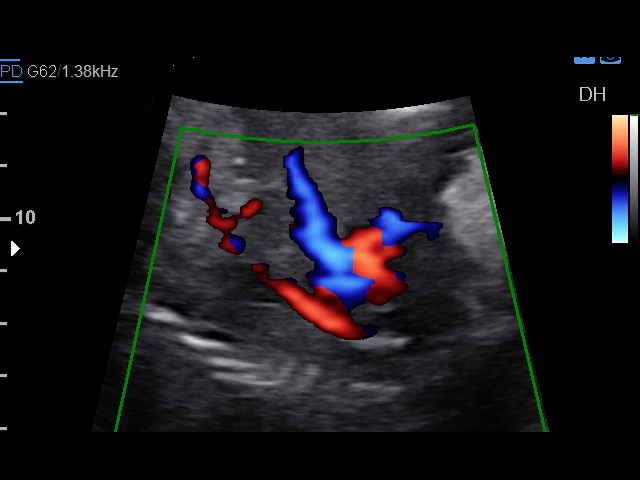

[14 of 28 positions shown; findings below may reference images not displayed]

pm)

Date:

[HOSPITAL][HOSPITAL]

1  KREKOR MINADAKI            [id number]       [id number]     [id number]
Indications

23 weeks gestation of pregnancy
Follow-up incomplete fetal anatomic             Z36
evaluation
Elevated MSAFP 2.7 MoM; elevated hCG
5.68 MoM
Obesity complicating pregnancy, second
trimester
Pre-existing essential hypertension
complicating pregnancy, second trimester;
ASA
OB History

Gravidity:     1         Term:  0        Prem:    0        SAB:   0
TOP:           0       Ectopic  0        Living:  0
:
Fetal Evaluation

Num Of Fetuses:      1
Fetal Heart          157
Rate(bpm):
Cardiac Activity:    Observed
Presentation:        Cephalic
Placenta:            Posterior, above cervical os
P. Cord Insertion:   Previously Visualized
Amniotic Fluid
AFI FV:      Subjectively within normal limits
Larg Pckt:      5.1  cm
Biometry

BPD:      56.7  mm     G. Age:   23w 2d                  CI:         71.4   %    70 - 86
FL/HC:      18.0   %    18.7 -
HC:      213.7  mm     G. Age:   23w 3d        28   %    HC/AC:      1.09        1.05 -
AC:      196.1  mm     G. Age:   24w 2d        64   %    FL/BPD      67.7   %    71 - 87
:
FL:       38.4  mm     G. Age:   22w 2d          8  %    FL/AC:      19.6   %    20 - 24
HUM:      35.4  mm     G. Age:   22w 2d        12   %

Est.         593   gm    1 lb 5 oz      50   %
FW:
Gestational Age

LMP:           29w 2d        Date:  10/18/14                  EDD:   07/25/15
U/S Today:     23w 2d                                         EDD:   09/05/15
Best:          23w 4d    Det. By:   Early Ultrasound          EDD:   09/03/15
(01/09/15)
Anatomy

Cranium:          Appears normal         Aortic Arch:       Color
Fetal Cavum:      Appears normal         Ductal Arch:       Not well visualized
Ventricles:       Appears normal         Diaphragm:         Previously seen
Choroid Plexus:   Previously seen        Stomach:           Appears normal,
left sided
Cerebellum:       Previously seen        Abdomen:           Appears normal
Posterior         Previously seen        Abdominal          Previously seen
Fossa:                                   Wall:
Nuchal Fold:      Previously seen        Cord Vessels:      Previously seen
Face:             Orbits and profile     Kidneys:           Appear normal
previously seen
Lips:             Appears normal         Bladder:           Appears normal
Heart:            Appears normal         Spine:             Previously seen
(4CH, axis, and
situs)
RVOT:             Appears normal         Upper              Previously seen
Extremities:
LVOT:             Appears normal         Lower              Previously seen
Extremities:

Other:   Female gender previously seen. Nasal bone previously visualized.
Technically difficult due to maternal habitus and fetal position.
Cervix Uterus Adnexa

Cervix
Length:               4  cm.
Normal appearance by transabdominal scan.

Adnexa:       No abnormality visualized.
Impression

SIUP at 23+4 weeks
Normal interval anatomy; anatomic survey complete except
for DA
Normal amniotic fluid volume
Appropriate interval growth with EFW at the 50th %tile
Recommendations

Serial growths in third trimester (elevated MSAFP and
hCG; BMI)

## 2018-11-19 ENCOUNTER — Other Ambulatory Visit: Payer: Medicaid Other

## 2018-11-19 ENCOUNTER — Other Ambulatory Visit: Payer: Self-pay | Admitting: Internal Medicine

## 2018-11-19 DIAGNOSIS — Z20822 Contact with and (suspected) exposure to covid-19: Secondary | ICD-10-CM

## 2018-11-26 LAB — NOVEL CORONAVIRUS, NAA: SARS-CoV-2, NAA: NOT DETECTED

## 2019-03-03 ENCOUNTER — Other Ambulatory Visit: Payer: Self-pay

## 2019-03-03 DIAGNOSIS — Z20822 Contact with and (suspected) exposure to covid-19: Secondary | ICD-10-CM

## 2019-03-05 LAB — NOVEL CORONAVIRUS, NAA: SARS-CoV-2, NAA: NOT DETECTED

## 2020-07-24 DIAGNOSIS — Z419 Encounter for procedure for purposes other than remedying health state, unspecified: Secondary | ICD-10-CM | POA: Diagnosis not present

## 2020-08-24 DIAGNOSIS — Z419 Encounter for procedure for purposes other than remedying health state, unspecified: Secondary | ICD-10-CM | POA: Diagnosis not present

## 2020-09-23 DIAGNOSIS — Z419 Encounter for procedure for purposes other than remedying health state, unspecified: Secondary | ICD-10-CM | POA: Diagnosis not present

## 2020-10-24 DIAGNOSIS — Z419 Encounter for procedure for purposes other than remedying health state, unspecified: Secondary | ICD-10-CM | POA: Diagnosis not present

## 2020-11-23 DIAGNOSIS — Z419 Encounter for procedure for purposes other than remedying health state, unspecified: Secondary | ICD-10-CM | POA: Diagnosis not present

## 2021-09-09 ENCOUNTER — Emergency Department (HOSPITAL_COMMUNITY)
Admission: EM | Admit: 2021-09-09 | Discharge: 2021-09-09 | Discharge status: Against Medical Advice | Payer: Medicaid Other

## 2021-09-09 NOTE — ED Notes (Addendum)
Was attempting to bring pt back for triage.  Pt told Ezzard Flax, EMT "I am going home if I die I die' ?

## 2021-11-17 ENCOUNTER — Ambulatory Visit (HOSPITAL_COMMUNITY)
Admission: EM | Admit: 2021-11-17 | Discharge: 2021-11-17 | Disposition: A | Discharge status: Home / Self Care | Payer: No Typology Code available for payment source | Attending: Physician Assistant | Admitting: Physician Assistant

## 2021-11-17 DIAGNOSIS — R509 Fever, unspecified: Secondary | ICD-10-CM | POA: Diagnosis present

## 2021-11-17 DIAGNOSIS — R1084 Generalized abdominal pain: Secondary | ICD-10-CM | POA: Diagnosis present

## 2021-11-17 DIAGNOSIS — M2550 Pain in unspecified joint: Secondary | ICD-10-CM | POA: Diagnosis present

## 2021-11-17 DIAGNOSIS — B349 Viral infection, unspecified: Secondary | ICD-10-CM | POA: Diagnosis present

## 2021-11-17 LAB — CBC WITH DIFFERENTIAL/PLATELET
Abs Immature Granulocytes: 0.04 10*3/uL (ref 0.00–0.07)
Basophils Absolute: 0 10*3/uL (ref 0.0–0.1)
Basophils Relative: 1 %
Eosinophils Absolute: 0 10*3/uL (ref 0.0–0.5)
Eosinophils Relative: 0 %
HCT: 40.5 % (ref 36.0–46.0)
Hemoglobin: 14 g/dL (ref 12.0–15.0)
Immature Granulocytes: 1 %
Lymphocytes Relative: 13 %
Lymphs Abs: 0.6 10*3/uL — ABNORMAL LOW (ref 0.7–4.0)
MCH: 29.9 pg (ref 26.0–34.0)
MCHC: 34.6 g/dL (ref 30.0–36.0)
MCV: 86.4 fL (ref 80.0–100.0)
Monocytes Absolute: 0.4 10*3/uL (ref 0.1–1.0)
Monocytes Relative: 10 %
Neutro Abs: 3.3 10*3/uL (ref 1.7–7.7)
Neutrophils Relative %: 75 %
Platelets: UNDETERMINED 10*3/uL (ref 150–400)
RBC: 4.69 MIL/uL (ref 3.87–5.11)
RDW: 12.3 % (ref 11.5–15.5)
WBC: 4.3 10*3/uL (ref 4.0–10.5)
nRBC: 0 % (ref 0.0–0.2)

## 2021-11-17 LAB — COMPREHENSIVE METABOLIC PANEL
ALT: 24 U/L (ref 0–44)
AST: 21 U/L (ref 15–41)
Albumin: 3.5 g/dL (ref 3.5–5.0)
Alkaline Phosphatase: 118 U/L (ref 38–126)
Anion gap: 8 (ref 5–15)
BUN: 6 mg/dL (ref 6–20)
CO2: 25 mmol/L (ref 22–32)
Calcium: 9.1 mg/dL (ref 8.9–10.3)
Chloride: 106 mmol/L (ref 98–111)
Creatinine, Ser: 0.94 mg/dL (ref 0.44–1.00)
GFR, Estimated: >60 mL/min (ref >60)
Glucose, Bld: 99 mg/dL (ref 70–99)
Potassium: 4.1 mmol/L (ref 3.5–5.1)
Sodium: 139 mmol/L (ref 135–145)
Total Bilirubin: 1.5 mg/dL — ABNORMAL HIGH (ref 0.3–1.2)
Total Protein: 6.6 g/dL (ref 6.5–8.1)

## 2021-11-17 LAB — POCT URINALYSIS DIPSTICK, ED / UC
Glucose, UA: NEGATIVE mg/dL
Hgb urine dipstick: NEGATIVE
Ketones, ur: NEGATIVE mg/dL
Leukocytes,Ua: NEGATIVE
Nitrite: NEGATIVE
Protein, ur: NEGATIVE mg/dL
Specific Gravity, Urine: 1.02 (ref 1.005–1.030)
Urobilinogen, UA: 4 mg/dL — ABNORMAL HIGH (ref 0.0–1.0)
pH: 6.5 (ref 5.0–8.0)

## 2021-11-17 MED ORDER — HYDROCODONE-ACETAMINOPHEN 5-325 MG PO TABS: 1.0000 | ORAL_TABLET | Freq: Four times a day (QID) | ORAL | 0 refills | Status: DC | PRN

## 2021-11-17 MED ORDER — PENICILLIN V POTASSIUM 500 MG PO TABS: 500.0000 mg | ORAL_TABLET | Freq: Three times a day (TID) | ORAL | 0 refills | Status: AC

## 2021-11-17 NOTE — ED Provider Notes (Signed)
[facility]    CSN: [id number] Arrival date & time: 11/17/21  1043      History   Chief Complaint Chief Complaint  Patient presents with   Abdominal Pain   Joint Pain    HPI Sara Vargas is a 31 y.o. female.   31 year old female presents with right upper tooth pain, joint pains all over the body for the past 2 days.  Patient relates that she has been having for the past several weeks increasing tooth pain of the upper tooth.  She also relates for the past 2 days she has been having joint pain of all her joints that is moderately severe.  Patient relates that the joints pain is constant, and on a scale of 1-10 she rates it as a 10.  Patient relates that she has been taking large doses of ibuprofen alternating with Tylenol but this has not decreased her joint pain.  Patient relates that she has not been having any cough congestion.  She does indicates she also has abdominal pain which is all over the abdomen, she relates that she has a lot of pressure sensation that is constant on a regular basis, she also has lower abdominal pain associated.  Patient relates she is not having any vomiting but she does have some intermittent nausea associated.  Patient relates she is not having any dysuria or frequency.  Patient does relate that she has had some diarrhea over the past 1 to 2 days.  Patient indicates she is not having any chills or sweats.  Patient smokes 1 pack of cigarettes daily. Patient indicates she did remove a tick from her right lower forearm this morning, the tick was attached.  Patient indicates she has not found any other ticks on her body.   Abdominal Pain   Past Medical History:  Diagnosis Date   Anxiety    Depression    Hypertension    labetolol 200 bid    Patient Active Problem List   Diagnosis Date Noted   Nexplanon insertion 10/10/2015   Status post primary low transverse cesarean section 08/29/2015   Morbid obesity with BMI of 50.0-59.9, adult (HCC)  01/18/2015   Chronic hypertension during pregnancy, antepartum    ABNORMAL WEIGHT GAIN 04/13/2007    Past Surgical History:  Procedure Laterality Date   arm surgery     CESAREAN SECTION N/A 08/28/2015   Procedure: CESAREAN SECTION;  Surgeon: Tereso Newcomer, MD;  Location: [facility];  Service: Obstetrics;  Laterality: N/A;   TONSILLECTOMY     URETERAL EXPLORATION      OB History     Gravida  1   Para  1   Term  1   Preterm      AB      Living  1      SAB      IAB      Ectopic      Multiple  0   Live Births  1            Home Medications    Prior to Admission medications   Medication Sig Start Date End Date Taking? Authorizing Provider  penicillin v potassium (VEETID) 500 MG tablet Take 1 tablet (500 mg total) by mouth 3 (three) times daily for 7 days. 11/17/21 11/24/21 Yes Ellsworth Lennox, PA-C  amLODipine (NORVASC) 5 MG tablet Take 1 tablet (5 mg total) by mouth daily. Patient taking differently: Take 5 mg by mouth at bedtime.  01/04/16  Cresenzo-Dishmon, Scarlette Calico, CNM  cyclobenzaprine (FLEXERIL) 5 MG tablet Take 1 tablet (5 mg total) by mouth 3 (three) times daily as needed for muscle spasms. 08/13/16   Burgess Amor, PA-C  HYDROcodone-acetaminophen (NORCO/VICODIN) 5-325 MG tablet Take 1-2 tablets by mouth every 6 (six) hours as needed for severe pain. 11/17/21   Ellsworth Lennox, PA-C  predniSONE (DELTASONE) 10 MG tablet Take 6 tablets day one, 5 tablets day two, 4 tablets day three, 3 tablets day four, 2 tablets day five, then 1 tablet day six 08/13/16   Burgess Amor, PA-C  Prenatal Vit-Fe Fumarate-FA (MULTIVITAMIN-PRENATAL) 27-0.8 MG TABS tablet Take 1 tablet by mouth daily at 12 noon. Patient taking differently: Take 1 tablet by mouth at bedtime.  01/04/16   Cresenzo-Dishmon, Scarlette Calico, CNM  venlafaxine XR (EFFEXOR-XR) 37.5 MG 24 hr capsule Take 37.5 mg by mouth daily with breakfast.    [provider]    Family History Family History  Problem Relation Age of  Onset   Heart disease Mother    COPD Mother    Asthma Mother    Other Father        benign brain tumor   Heart disease Maternal Grandmother     Social History Social History   Tobacco Use   Smoking status: Every Day    Packs/day: 1.00    Years: 10.00    Total pack years: 10.00    Types: Cigarettes    Last attempt to quit: 03/09/2015    Years since quitting: 6.6   Smokeless tobacco: Never  Substance Use Topics   Alcohol use: No   Drug use: No    Types: Marijuana     Allergies   Patient has no known allergies.   Review of Systems Review of Systems  HENT:  Positive for dental problem (tooth pain).   Gastrointestinal:  Positive for abdominal pain.  Musculoskeletal:  Positive for arthralgias (all over body).     Physical Exam Triage Vital Signs ED Triage Vitals  Enc Vitals Group     BP 11/17/21 1159 (!) 168/94     Pulse Rate 11/17/21 1159 82     Resp 11/17/21 1159 18     Temp 11/17/21 1156 99.6 F (37.6 C)     Temp Source 11/17/21 1156 Oral     SpO2 11/17/21 1159 98 %     Weight --      Height --      Head Circumference --      Peak Flow --      Pain Score --      Pain Loc --      Pain Edu? --      Excl. in GC? --    No data found.  Updated Vital Signs BP (!) 168/94 (BP Location: Left Arm)   Pulse 82   Temp 99.6 F (37.6 C) (Oral)   Resp 18   SpO2 98%   Visual Acuity Right Eye Distance:   Left Eye Distance:   Bilateral Distance:    Right Eye Near:   Left Eye Near:    Bilateral Near:     Physical Exam Constitutional:      Appearance: She is well-developed.  HENT:     Right Ear: Tympanic membrane and ear canal normal.     Left Ear: Tympanic membrane and ear canal normal.     Mouth/Throat:     Mouth: Mucous membranes are moist.     Pharynx: Oropharynx is clear. Uvula midline.  Cardiovascular:  Rate and Rhythm: Normal rate and regular rhythm.     Heart sounds: Normal heart sounds.  Pulmonary:     Effort: Pulmonary effort is normal.      Breath sounds: Normal breath sounds and air entry. No wheezing, rhonchi or rales.  Abdominal:     General: Abdomen is flat. Bowel sounds are normal.     Palpations: Abdomen is soft.     Tenderness: There is generalized abdominal tenderness. There is no guarding or rebound. Negative signs include McBurney's sign.  Lymphadenopathy:     Cervical: No cervical adenopathy.  Neurological:     Mental Status: She is alert.      UC Treatments / Results  Labs (all labs ordered are listed, but only abnormal results are displayed) Labs Reviewed  POCT URINALYSIS DIPSTICK, ED / UC - Abnormal; Notable for the following components:      Result Value   Bilirubin Urine SMALL (*)    Urobilinogen, UA 4.0 (*)    All other components within normal limits  URINE CULTURE  CBC WITH DIFFERENTIAL/PLATELET  COMPREHENSIVE METABOLIC PANEL    EKG   Radiology No results found.  Procedures Procedures (including critical care time)  Medications Ordered in UC Medications - No data to display  Initial Impression / Assessment and Plan / UC Course  I have reviewed the triage vital signs and the nursing notes.  Pertinent labs & imaging results that were available during my care of the patient were reviewed by me and considered in my medical decision making (see chart for details).    Plan: 1.  A CBC and CMP have have been drawn and is pending. 2.  Patient has been advised to take the Vicodin tablets 1-2 every 6-8 hours as needed for the joint pain. 3.  Patient has been advised to follow-up with PCP for evaluation of the abdominal and the joint pain. 4.  Patient advised to report to ER or return to urgent care if symptoms fail to improve. Final Clinical Impressions(s) / UC Diagnoses   Final diagnoses:  Generalized abdominal pain  Arthralgia, unspecified joint  Fever, unspecified  Viral syndrome     Discharge Instructions      Advised to make appointment with your family practice provider  to have some of these symptoms evaluated. I have drawn a CBC, and a metabolic profile, these labs will be completed in 48 hours and if you do not receive a call from the urgent care within that time than that indicates the labs are all normal. Advised to take medication Vicodin tablets 1-2 every 6-8 hours as needed for pain over the next 48 hours. Follow-up with PCP or return to urgent care if symptoms fail to improve.    ED Prescriptions     Medication Sig Dispense Auth. Provider   HYDROcodone-acetaminophen (NORCO/VICODIN) 5-325 MG tablet Take 1-2 tablets by mouth every 6 (six) hours as needed for severe pain. 16 tablet Ellsworth Lennox, PA-C   penicillin v potassium (VEETID) 500 MG tablet Take 1 tablet (500 mg total) by mouth 3 (three) times daily for 7 days. 21 tablet Ellsworth Lennox, PA-C      I have reviewed the PDMP during this encounter.   Ellsworth Lennox, PA-C 11/17/21 1306

## 2021-11-17 NOTE — ED Triage Notes (Signed)
Pt presents with several concerns today. She reports all over joint pain. She took a home text for covid and it was negative.

## 2021-11-18 LAB — URINE CULTURE

## 2021-12-11 ENCOUNTER — Other Ambulatory Visit: Payer: Self-pay

## 2021-12-11 MED ORDER — NITROFURANTOIN MONOHYD MACRO 100 MG PO CAPS
ORAL_CAPSULE | ORAL | 0 refills | Status: DC
Start: 2021-12-11 — End: 2023-07-09
  Filled 2021-12-11: qty 10, 5d supply, fill #0

## 2021-12-26 ENCOUNTER — Other Ambulatory Visit: Payer: Self-pay

## 2021-12-26 ENCOUNTER — Other Ambulatory Visit (HOSPITAL_COMMUNITY)
Admission: RE | Admit: 2021-12-26 | Discharge: 2021-12-26 | Disposition: A | Discharge status: Home / Self Care | Payer: No Typology Code available for payment source | Source: Ambulatory Visit | Attending: Nurse Practitioner | Admitting: Nurse Practitioner

## 2021-12-26 DIAGNOSIS — Z124 Encounter for screening for malignant neoplasm of cervix: Secondary | ICD-10-CM | POA: Diagnosis present

## 2021-12-26 MED ORDER — CLINDAMYCIN PHOSPHATE 1 % EX SOLN
CUTANEOUS | 2 refills | Status: AC
  Filled 2021-12-26: qty 60, 30d supply, fill #0

## 2021-12-27 ENCOUNTER — Other Ambulatory Visit: Payer: Self-pay

## 2021-12-30 ENCOUNTER — Other Ambulatory Visit: Payer: Self-pay

## 2021-12-30 LAB — CYTOLOGY - PAP
Comment: NEGATIVE
Diagnosis: NEGATIVE
Diagnosis: REACTIVE
High risk HPV: NEGATIVE

## 2022-02-26 ENCOUNTER — Ambulatory Visit (HOSPITAL_COMMUNITY)
Admission: EM | Admit: 2022-02-26 | Discharge: 2022-02-26 | Disposition: A | Discharge status: Home / Self Care | Payer: No Typology Code available for payment source

## 2022-02-26 DIAGNOSIS — H6502 Acute serous otitis media, left ear: Secondary | ICD-10-CM

## 2022-02-26 DIAGNOSIS — J069 Acute upper respiratory infection, unspecified: Secondary | ICD-10-CM

## 2022-02-26 MED ORDER — PREDNISONE 20 MG PO TABS: 40.0000 mg | ORAL_TABLET | Freq: Every day | ORAL | 0 refills | Status: DC

## 2022-02-26 MED ORDER — AMOXICILLIN-POT CLAVULANATE 875-125 MG PO TABS: 1.0000 | ORAL_TABLET | Freq: Two times a day (BID) | ORAL | 0 refills | Status: AC

## 2022-02-26 MED ORDER — PROMETHAZINE-DM 6.25-15 MG/5ML PO SYRP: 5.0000 mL | ORAL_SOLUTION | Freq: Four times a day (QID) | ORAL | 0 refills | Status: DC | PRN

## 2022-02-26 MED ORDER — BENZONATATE 100 MG PO CAPS: 100.0000 mg | ORAL_CAPSULE | Freq: Three times a day (TID) | ORAL | 0 refills | Status: DC

## 2022-02-26 NOTE — Discharge Instructions (Signed)
On exam there is congestion within your nose and your left ear drum is red and there is wheezing within your lungs, since your symptoms have been present for 2 weeks it is most likely being exacerbated by bacteria in the body and therefore you will be started on antibiotic  Take Augmentin every morning and every evening for 10 days, ideally you will start to see improvement after 48 hours of medicine use  Begin use of prednisone every morning with food for 5 days, this medicine helps to reduce inflammation and will help calm your breathing  You may use Tessalon pill every 8 hours to help calm your coughing  You may use cough syrup every 6 hours for additional comfort, be mindful this medication may make you drowsy You can take Tylenol and/or Ibuprofen as needed for fever reduction and pain relief.   For cough: honey 1/2 to 1 teaspoon (you can dilute the honey in water or another fluid).  You can also use guaifenesin and dextromethorphan for cough. You can use a humidifier for chest congestion and cough.  If you don't have a humidifier, you can sit in the bathroom with the hot shower running.      For sore throat: try warm salt water gargles, cepacol lozenges, throat spray, warm tea or water with lemon/honey, popsicles or ice, or OTC cold relief medicine for throat discomfort.   For congestion: take a daily anti-histamine like Zyrtec, Claritin, and a oral decongestant, such as pseudoephedrine.  You can also use Flonase 1-2 sprays in each nostril daily.   It is important to stay hydrated: drink plenty of fluids (water, gatorade/powerade/pedialyte, juices, or teas) to keep your throat moisturized and help further relieve irritation/discomfort.

## 2022-02-26 NOTE — ED Provider Notes (Signed)
[facility]    CSN: [id number] Arrival date & time: 02/26/22  0818      History   Chief Complaint Chief Complaint  Patient presents with   Otalgia   Shortness of Breath   Nasal Congestion   Dizziness    HPI Sara Vargas is a 31 y.o. female.   Patient presents with body aches, nasal congestion, rhinorrhea, sinus pain and pressure, left-sided ear fullness with decreased hearing, sore throat, productive cough, shortness of breath and wheezing for 2 weeks.  Decreased appetite but tolerating some food and fluids, endorses a decreased ability to taste.  Has attempted use of Mucinex, Robitussin and Alka-Seltzer cold and flu which has been minimally helpful.  History of a tonsillectomy.  Daily smoker.  COVID PCR test  negative.   Past Medical History:  Diagnosis Date   Anxiety    Depression    Hypertension    labetolol 200 bid    Patient Active Problem List   Diagnosis Date Noted   Nexplanon insertion 10/10/2015   Status post primary low transverse cesarean section 08/29/2015   Morbid obesity with BMI of 50.0-59.9, adult (HCC) 01/18/2015   Chronic hypertension during pregnancy, antepartum    ABNORMAL WEIGHT GAIN 04/13/2007    Past Surgical History:  Procedure Laterality Date   arm surgery     CESAREAN SECTION N/A 08/28/2015   Procedure: CESAREAN SECTION;  Surgeon: Tereso Newcomer, MD;  Location: [facility];  Service: Obstetrics;  Laterality: N/A;   TONSILLECTOMY     URETERAL EXPLORATION      OB History     Gravida  1   Para  1   Term  1   Preterm      AB      Living  1      SAB      IAB      Ectopic      Multiple  0   Live Births  1            Home Medications    Prior to Admission medications   Medication Sig Start Date End Date Taking? Authorizing Provider  clonazePAM (KLONOPIN) 1 MG tablet Take 1 mg by mouth 3 (three) times daily as needed for anxiety.   Yes [provider]  sertraline (ZOLOFT) 100 MG tablet Take 100  mg by mouth daily.   Yes [provider]  amLODipine (NORVASC) 5 MG tablet Take 1 tablet (5 mg total) by mouth daily. Patient taking differently: Take 5 mg by mouth at bedtime.  01/04/16   Cresenzo-Dishmon, Scarlette Calico, CNM  clindamycin (CLEOCIN T) 1 % external solution Apply topically twice a day as directed. 12/26/21     cyclobenzaprine (FLEXERIL) 5 MG tablet Take 1 tablet (5 mg total) by mouth 3 (three) times daily as needed for muscle spasms. 08/13/16   Burgess Amor, PA-C  HYDROcodone-acetaminophen (NORCO/VICODIN) 5-325 MG tablet Take 1-2 tablets by mouth every 6 (six) hours as needed for severe pain. 11/17/21   Ellsworth Lennox, PA-C  nitrofurantoin, macrocrystal-monohydrate, (MACROBID) 100 MG capsule TAKE 1 capsule BY MOUTH twice a day FOR 5 days 12/11/21     predniSONE (DELTASONE) 10 MG tablet Take 6 tablets day one, 5 tablets day two, 4 tablets day three, 3 tablets day four, 2 tablets day five, then 1 tablet day six 08/13/16   Burgess Amor, PA-C  Prenatal Vit-Fe Fumarate-FA (MULTIVITAMIN-PRENATAL) 27-0.8 MG TABS tablet Take 1 tablet by mouth daily at 12 noon. Patient taking differently:  Take 1 tablet by mouth at bedtime. 01/04/16   Cresenzo-Dishmon, Scarlette Calico, CNM  venlafaxine XR (EFFEXOR-XR) 37.5 MG 24 hr capsule Take 37.5 mg by mouth daily with breakfast.    [provider]    Family History Family History  Problem Relation Age of Onset   Heart disease Mother    COPD Mother    Asthma Mother    Other Father        benign brain tumor   Heart disease Maternal Grandmother     Social History Social History   Tobacco Use   Smoking status: Every Day    Packs/day: 1.00    Years: 10.00    Total pack years: 10.00    Types: Cigarettes    Last attempt to quit: 03/09/2015    Years since quitting: 6.9   Smokeless tobacco: Never  Substance Use Topics   Alcohol use: No   Drug use: No    Types: Marijuana     Allergies   Patient has no known allergies.   Review of Systems Review  of Systems  Constitutional: Negative.   HENT:  Positive for congestion, ear pain, rhinorrhea, sinus pressure, sinus pain, sore throat and voice change. Negative for dental problem, drooling, ear discharge, facial swelling, hearing loss, mouth sores, nosebleeds, postnasal drip, sneezing, tinnitus and trouble swallowing.   Respiratory:  Positive for cough and shortness of breath. Negative for apnea, choking, chest tightness, wheezing and stridor.   Musculoskeletal:  Positive for myalgias. Negative for arthralgias, back pain, gait problem, joint swelling, neck pain and neck stiffness.  Skin: Negative.   Neurological:  Positive for dizziness. Negative for tremors, seizures, syncope, facial asymmetry, speech difficulty, weakness, light-headedness, numbness and headaches.     Physical Exam Triage Vital Signs ED Triage Vitals  Enc Vitals Group     BP 02/26/22 0931 (!) 147/91     Pulse Rate 02/26/22 0931 72     Resp 02/26/22 0931 16     Temp 02/26/22 0931 97.9 F (36.6 C)     Temp Source 02/26/22 0931 Oral     SpO2 02/26/22 0931 96 %     Weight --      Height --      Head Circumference --      Peak Flow --      Pain Score 02/26/22 0939 6     Pain Loc --      Pain Edu? --      Excl. in GC? --    No data found.  Updated Vital Signs BP (!) 147/91 (BP Location: Left Wrist)   Pulse 72   Temp 97.9 F (36.6 C) (Oral)   Resp 16   SpO2 96%   Visual Acuity Right Eye Distance:   Left Eye Distance:   Bilateral Distance:    Right Eye Near:   Left Eye Near:    Bilateral Near:     Physical Exam Constitutional:      Appearance: Normal appearance. She is well-developed.  HENT:     Head: Normocephalic.     Right Ear: Hearing, tympanic membrane, ear canal and external ear normal.     Left Ear: Hearing, ear canal and external ear normal. Tympanic membrane is erythematous.     Nose: Congestion and rhinorrhea present.     Mouth/Throat:     Mouth: Mucous membranes are moist.     Pharynx:  Oropharynx is clear.  Eyes:     Extraocular Movements: Extraocular movements intact.  Cardiovascular:  Rate and Rhythm: Normal rate and regular rhythm.     Pulses: Normal pulses.     Heart sounds: Normal heart sounds.  Pulmonary:     Effort: Pulmonary effort is normal.     Breath sounds: Wheezing present.  Musculoskeletal:     Cervical back: Normal range of motion and neck supple.  Skin:    General: Skin is warm and dry.  Neurological:     Mental Status: She is alert and oriented to person, place, and time. Mental status is at baseline.  Psychiatric:        Mood and Affect: Mood normal.        Behavior: Behavior normal.      UC Treatments / Results  Labs (all labs ordered are listed, but only abnormal results are displayed) Labs Reviewed - No data to display  EKG   Radiology No results found.  Procedures Procedures (including critical care time)  Medications Ordered in UC Medications - No data to display  Initial Impression / Assessment and Plan / UC Course  I have reviewed the triage vital signs and the nursing notes.  Pertinent labs & imaging results that were available during my care of the patient were reviewed by me and considered in my medical decision making (see chart for details).  Acute upper respiratory infection, nonrecurrent acute serous otitis media of left ear  Patient is in no signs of distress nor toxic appearing.  Vital signs are stable.  Low suspicion for pneumonia, pneumothorax or bronchitis and therefore will defer imaging.  Symptoms have been present for 2 weeks and there is infection to the left ear canal therefore started on antibiotics  Prescribed Augmentin, prednisone, Tessalon and Promethazine DM   May use additional over-the-counter medications as needed for supportive care.  May follow-up with urgent care as needed if symptoms persist or worsen.  Final Clinical Impressions(s) / UC Diagnoses   Final diagnoses:  None   Discharge  Instructions   None    ED Prescriptions   None    PDMP not reviewed this encounter.   Hans Eden, NP 02/26/22 1004

## 2022-02-26 NOTE — ED Triage Notes (Signed)
Pt is here for SOB, headache, dizziness, body aches, chest congestion , cough and left ear clogged x 2wks

## 2022-04-14 ENCOUNTER — Emergency Department (HOSPITAL_BASED_OUTPATIENT_CLINIC_OR_DEPARTMENT_OTHER): Payer: No Typology Code available for payment source

## 2022-04-14 ENCOUNTER — Encounter (HOSPITAL_BASED_OUTPATIENT_CLINIC_OR_DEPARTMENT_OTHER): Payer: Self-pay

## 2022-04-14 ENCOUNTER — Other Ambulatory Visit: Payer: Self-pay

## 2022-04-14 ENCOUNTER — Emergency Department (HOSPITAL_BASED_OUTPATIENT_CLINIC_OR_DEPARTMENT_OTHER)
Admission: EM | Admit: 2022-04-14 | Discharge: 2022-04-14 | Disposition: A | Discharge status: Home / Self Care | Payer: No Typology Code available for payment source | Attending: Emergency Medicine | Admitting: Emergency Medicine

## 2022-04-14 DIAGNOSIS — Y92410 Unspecified street and highway as the place of occurrence of the external cause: Secondary | ICD-10-CM | POA: Insufficient documentation

## 2022-04-14 DIAGNOSIS — S0990XA Unspecified injury of head, initial encounter: Secondary | ICD-10-CM

## 2022-04-14 DIAGNOSIS — S0083XA Contusion of other part of head, initial encounter: Secondary | ICD-10-CM | POA: Insufficient documentation
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
## 2022-04-14 NOTE — ED Notes (Signed)
Cervical collar removed by provider.

## 2023-02-11 ENCOUNTER — Other Ambulatory Visit: Payer: Self-pay

## 2023-02-13 ENCOUNTER — Other Ambulatory Visit: Payer: Self-pay

## 2023-02-13 MED ORDER — SEMAGLUTIDE-WEIGHT MANAGEMENT 0.5 MG/0.5ML ~~LOC~~ SOAJ
0.5000 mg | SUBCUTANEOUS | 0 refills | Status: DC
  Filled 2023-02-13: qty 2, 28d supply, fill #0

## 2023-03-06 ENCOUNTER — Other Ambulatory Visit: Payer: Self-pay

## 2023-03-06 MED ORDER — WEGOVY 1 MG/0.5ML ~~LOC~~ SOAJ
0.5000 mL | SUBCUTANEOUS | 0 refills | Status: DC
  Filled 2023-03-06: qty 2, 28d supply, fill #0

## 2023-03-12 ENCOUNTER — Other Ambulatory Visit: Payer: Self-pay

## 2023-04-03 ENCOUNTER — Other Ambulatory Visit: Payer: Self-pay

## 2023-04-03 MED ORDER — WEGOVY 1.7 MG/0.75ML ~~LOC~~ SOAJ
0.7500 mL | SUBCUTANEOUS | 0 refills | Status: DC
  Filled 2023-04-03: qty 3, 28d supply, fill #0

## 2023-04-06 ENCOUNTER — Other Ambulatory Visit: Payer: Self-pay

## 2023-04-10 ENCOUNTER — Other Ambulatory Visit: Payer: Self-pay

## 2023-05-01 ENCOUNTER — Other Ambulatory Visit: Payer: Self-pay

## 2023-05-01 MED ORDER — WEGOVY 2.4 MG/0.75ML ~~LOC~~ SOAJ
2.4000 mg | SUBCUTANEOUS | 0 refills | Status: AC
  Filled 2023-05-01: qty 3, 28d supply, fill #0

## 2023-06-02 ENCOUNTER — Other Ambulatory Visit: Payer: Self-pay

## 2023-06-09 ENCOUNTER — Other Ambulatory Visit: Payer: Self-pay

## 2023-06-15 ENCOUNTER — Other Ambulatory Visit: Payer: Self-pay

## 2023-07-06 ENCOUNTER — Encounter (HOSPITAL_BASED_OUTPATIENT_CLINIC_OR_DEPARTMENT_OTHER): Payer: Self-pay

## 2023-07-06 ENCOUNTER — Emergency Department (HOSPITAL_COMMUNITY): Payer: No Typology Code available for payment source

## 2023-07-06 ENCOUNTER — Other Ambulatory Visit: Payer: Self-pay

## 2023-07-06 ENCOUNTER — Emergency Department (HOSPITAL_BASED_OUTPATIENT_CLINIC_OR_DEPARTMENT_OTHER): Payer: No Typology Code available for payment source

## 2023-07-06 ENCOUNTER — Emergency Department (HOSPITAL_BASED_OUTPATIENT_CLINIC_OR_DEPARTMENT_OTHER)
Admission: EM | Admit: 2023-07-06 | Discharge: 2023-07-06 | Disposition: A | Discharge status: Home / Self Care | Payer: No Typology Code available for payment source | Attending: Emergency Medicine | Admitting: Emergency Medicine

## 2023-07-06 DIAGNOSIS — I10 Essential (primary) hypertension: Secondary | ICD-10-CM | POA: Insufficient documentation

## 2023-07-06 DIAGNOSIS — H538 Other visual disturbances: Secondary | ICD-10-CM | POA: Diagnosis present

## 2023-07-06 DIAGNOSIS — Z79899 Other long term (current) drug therapy: Secondary | ICD-10-CM | POA: Insufficient documentation

## 2023-07-06 DIAGNOSIS — R519 Headache, unspecified: Secondary | ICD-10-CM | POA: Diagnosis not present

## 2023-07-06 LAB — CBC
HCT: 44.7 % (ref 36.0–46.0)
Hemoglobin: 15.1 g/dL — ABNORMAL HIGH (ref 12.0–15.0)
MCH: 29.8 pg (ref 26.0–34.0)
MCHC: 33.8 g/dL (ref 30.0–36.0)
MCV: 88.3 fL (ref 80.0–100.0)
Platelets: 182 10*3/uL (ref 150–400)
RBC: 5.06 MIL/uL (ref 3.87–5.11)
RDW: 13.2 % (ref 11.5–15.5)
WBC: 8.8 10*3/uL (ref 4.0–10.5)
nRBC: 0 % (ref 0.0–0.2)

## 2023-07-06 LAB — BASIC METABOLIC PANEL
Anion gap: 8 (ref 5–15)
BUN: 8 mg/dL (ref 6–20)
CO2: 23 mmol/L (ref 22–32)
Calcium: 8.9 mg/dL (ref 8.9–10.3)
Chloride: 108 mmol/L (ref 98–111)
Creatinine, Ser: 0.79 mg/dL (ref 0.44–1.00)
GFR, Estimated: >60 mL/min (ref >60)
Glucose, Bld: 94 mg/dL (ref 70–99)
Potassium: 4.1 mmol/L (ref 3.5–5.1)
Sodium: 139 mmol/L (ref 135–145)

## 2023-07-06 LAB — PREGNANCY, URINE: Preg Test, Ur: NEGATIVE

## 2023-07-06 MED ORDER — CLONAZEPAM 0.5 MG PO TABS
1.0000 mg | ORAL_TABLET | Freq: Two times a day (BID) | ORAL | Status: DC | PRN
  Administered 2023-07-06: 1 mg via ORAL
  Filled 2023-07-06: qty 2

## 2023-07-06 MED ORDER — ACETAZOLAMIDE 125 MG PO TABS: 500.0000 mg | ORAL_TABLET | Freq: Two times a day (BID) | ORAL | 0 refills | Status: DC

## 2023-07-06 MED ORDER — TETRACAINE HCL 0.5 % OP SOLN
2.0000 [drp] | Freq: Once | OPHTHALMIC | Status: DC
  Filled 2023-07-06: qty 4

## 2023-07-06 MED ORDER — FLUORESCEIN SODIUM 1 MG OP STRP
1.0000 | ORAL_STRIP | Freq: Once | OPHTHALMIC | Status: DC
  Filled 2023-07-06: qty 1

## 2023-07-06 MED ORDER — GADOBUTROL 1 MMOL/ML IV SOLN
10.0000 mL | Freq: Once | INTRAVENOUS | Status: AC | PRN
  Administered 2023-07-06: 10 mL via INTRAVENOUS
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
## 2023-07-06 NOTE — ED Notes (Signed)
Attempted IV twice without success  

## 2023-07-06 NOTE — ED Provider Notes (Signed)
 Patient was reassessed after her MRI evaluation.  She has been seen by an ophthalmologist and evaluated by initial ED prior to transfer for MRI.  She has been having intermittent headaches for some time, weeks perhaps, occasionally with blurred vision in both eyes.  She is currently having a headache which is not atypical for her, although it is mild, posterior.  She does not have any visual loss at this time.  She was seen by an ophthalmologist who was concerned about papilledema and referred her in for further evaluation.  I reviewed her MRI, MRV.  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  No acute thrombosis.  No evidence of demyelinating lesions.  No evidence of intracranial mass.  The patient is not certain that she is having daily headaches, but thinks less likely.  She says that she does not notice a lot of them where she is used to living with them.  We discussed further workup for IIH.  This would likely include a diagnostic and therapeutic lumbar puncture.  She does not want this done at this time.  She would prefer to have this scheduled or done as an outpatient with a neurologist, which may be reasonable, as she is not currently having visual loss.  I did stress importance of follow-up with neurology.  In the meantime I will start her on Diamox  500 mg twice daily based on her symptomatology.  A referral was placed to neurology.  Strict return precautions were discussed.   Arvilla Birmingham, MD 07/06/23 2046

## 2023-07-06 NOTE — ED Triage Notes (Signed)
 Sent by her eye Dr for elevated pressures in her eyes. R/O MS or brain tumor. Intermittent headaches and blurred vision. Patient's eyes  are currently dilated.

## 2023-07-06 NOTE — ED Notes (Signed)
 Patient verbalizes understanding of discharge instructions. Opportunity for questioning and answers were provided. Armband removed by staff, pt discharged from ED. Pt ambulatory to ED waiting room with steady gait.

## 2023-07-06 NOTE — ED Provider Notes (Signed)
 [facility] EMERGENCY DEPARTMENT AT [facility] Provider Note   CSN: [id number] Arrival date & time: 07/06/23  8413     History  Chief Complaint  Patient presents with   Eye Problem    Sara Vargas is a 33 y.o. female.   Eye Problem   33 year old female presents emergency department after visit with her ophthalmologist.  Patient states that she has been dealing with intermittent symptoms of right sided darkened vision, spots in her right vision as well as dizzy sensation.  States that ever since she got the COVID-vaccine, has had 2 episodes where the right half of her right sided vision would go black.  She is a prior to experiencing this dark in vision, would see waves in both of her eyes.  States that she thought it was related to an ocular headache; symptoms would resolve over a minute or 2.  Patient reports more recently experiencing dizzy sensation as if she is going to pass out, uncontrollable eye movements described as "nystagmus".  States that during these episodes, also has weakness bilaterally in her arms and legs.  States that they similarly occur for 1 to 3 minutes before spontaneously resolving.  When she first noticed the symptoms, it was when she stood up from a seated position.  States that she has been mainly when she is driving now.  States that they can be somewhat infrequent in nature but sometimes occurring twice a day.  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  Patient states she currently is without any visual symptoms, weakness/sensory deficits in upper lower extremities, slurred speech, facial droop.  States that she is having a slight headache in the forehead region ever since her eyes were dilated but otherwise feels at baseline.  Past medical history significant for hypertension, depression,  anxiety, obesity  Home Medications Prior to Admission medications   Medication Sig Start Date End Date Taking? Authorizing Provider  acetaZOLAMIDE (DIAMOX) 125 MG tablet Take 4 tablets (500 mg total) by mouth 2 (two) times daily. 07/06/23 08/05/23 Yes Trifan, Kermit Balo, MD  amLODipine (NORVASC) 5 MG tablet Take 1 tablet (5 mg total) by mouth daily. Patient taking differently: Take 5 mg by mouth at bedtime.  01/04/16   Cresenzo-Dishmon, Scarlette Calico, CNM  benzonatate (TESSALON) 100 MG capsule Take 1 capsule (100 mg total) by mouth every 8 (eight) hours. 02/26/22   White, Elita Boone, NP  clindamycin (CLEOCIN T) 1 % external solution Apply topically twice a day as directed. 12/26/21     clonazePAM (KLONOPIN) 1 MG tablet Take 1 mg by mouth 3 (three) times daily as needed for anxiety.    [provider]  cyclobenzaprine (FLEXERIL) 5 MG tablet Take 1 tablet (5 mg total) by mouth 3 (three) times daily as needed for muscle spasms. 08/13/16   Burgess Amor, PA-C  HYDROcodone-acetaminophen (NORCO/VICODIN) 5-325 MG tablet Take 1-2 tablets by mouth every 6 (six) hours as needed for severe pain. 11/17/21   Ellsworth Lennox, PA-C  nitrofurantoin, macrocrystal-monohydrate, (MACROBID) 100 MG capsule TAKE 1 capsule BY MOUTH twice a day FOR 5 days 12/11/21     predniSONE (DELTASONE) 20 MG tablet Take 2 tablets (40 mg total) by mouth daily. 02/26/22   White, Elita Boone, NP  Prenatal Vit-Fe Fumarate-FA (MULTIVITAMIN-PRENATAL) 27-0.8 MG TABS tablet Take 1 tablet by mouth daily at  12 noon. Patient taking differently: Take 1 tablet by mouth at bedtime. 01/04/16   Cresenzo-Dishmon, Scarlette Calico, CNM  promethazine-dextromethorphan (PROMETHAZINE-DM) 6.25-15 MG/5ML syrup Take 5 mLs by mouth 4 (four) times daily as needed for cough. 02/26/22   White, Elita Boone, NP  Semaglutide-Weight Management (WEGOVY) 1 MG/0.5ML SOAJ Inject 1 mg (0.59ml)  into the skin once a week. 03/06/23     Semaglutide-Weight Management (WEGOVY) 1.7 MG/0.75ML SOAJ Inject  1.7 mg into the skin once a week. 04/03/23     Semaglutide-Weight Management (WEGOVY) 2.4 MG/0.75ML SOAJ Inject 2.4 mg into the skin once a week. 05/01/23     Semaglutide-Weight Management 0.5 MG/0.5ML SOAJ Inject 0.5 mg into the skin once a week. 02/13/23     sertraline (ZOLOFT) 100 MG tablet Take 100 mg by mouth daily.    [provider]  venlafaxine XR (EFFEXOR-XR) 37.5 MG 24 hr capsule Take 37.5 mg by mouth daily with breakfast.    [provider]      Allergies    Patient has no known allergies.    Review of Systems   Review of Systems  All other systems reviewed and are negative.   Physical Exam Updated Vital Signs BP (!) 152/78 (BP Location: Right Arm)   Pulse 77   Temp 98.1 F (36.7 C) (Oral)   Resp 16   Ht 5\' 5"  (1.651 m)   Wt (!) 138.3 kg   SpO2 97%   BMI 50.75 kg/m  Physical Exam Vitals and nursing note reviewed.  Constitutional:      General: She is not in acute distress.    Appearance: She is well-developed.  HENT:     Head: Normocephalic and atraumatic.  Eyes:     Intraocular pressure: Left eye pressure is 19 mmHg. Measurements were taken using a handheld tonometer.    Extraocular Movements: Extraocular movements intact.     Right eye: Normal extraocular motion and no nystagmus.     Left eye: Normal extraocular motion and no nystagmus.     Conjunctiva/sclera: Conjunctivae normal.     Comments: Pupils 6 to 7 mm bilaterally.  EOMs intact without obvious nystagmus.  Fluorescein exam showed no obvious uptake.  Seidel sign negative.  IOP's as above.  OD 20/15, OS 20/30  Cardiovascular:     Rate and Rhythm: Normal rate and regular rhythm.     Heart sounds: No murmur heard. Pulmonary:     Effort: Pulmonary effort is normal. No respiratory distress.     Breath sounds: Normal breath sounds.  Abdominal:     Palpations: Abdomen is soft.     Tenderness: There is no abdominal tenderness.  Musculoskeletal:        General: No swelling.     Cervical  back: Neck supple.  Skin:    General: Skin is warm and dry.     Capillary Refill: Capillary refill takes less than 2 seconds.  Neurological:     Mental Status: She is alert.     Comments: Alert and oriented to self, place, time and event.   Speech is fluent, clear without dysarthria or dysphasia.   Strength 5/5 in upper/lower extremities   Sensation intact in upper/lower extremities   Normal gait.  CN I not tested  CN II not tested CN III, IV, VI pupils equal fixed and dilated position after eye dilation exam performed at ophthalmologist EOMs intact bilaterally  CN V Intact sensation to sharp and light touch to the face  CN VII facial movements symmetric  CN VIII not tested  CN IX, X no uvula deviation, symmetric rise of soft palate  CN XI 5/5 SCM and trapezius strength bilaterally  CN XII Midline tongue protrusion, symmetric L/R movements     Psychiatric:        Mood and Affect: Mood normal.          ED Results / Procedures / Treatments   Labs (all labs ordered are listed, but only abnormal results are displayed) Labs Reviewed  CBC - Abnormal; Notable for the following components:      Result Value   Hemoglobin 15.1 (*)    All other components within normal limits  BASIC METABOLIC PANEL  PREGNANCY, URINE    EKG None  Radiology MR BRAIN W WO CONTRAST Result Date: 07/06/2023 CLINICAL DATA:  Intermittent headaches and blurry vision, elevated pressure in eyes, concern for multiple sclerosis or brain tumor. EXAM: MRI HEAD AND ORBITS WITHOUT AND WITH CONTRAST MR VENOGRAM HEAD WITHOUT AND WITH CONTRAST TECHNIQUE: Multiplanar, multiecho pulse sequences of the brain and surrounding structures were obtained without and with intravenous contrast. Multiplanar, multiecho pulse sequences of the orbits and surrounding structures were obtained including fat saturation techniques, before and after intravenous contrast administration. Angiographic images of the intracranial venous  structures were acquired using MRV technique without intravenous contrast and using reconstructions from the MRI head postcontrast sequences. CONTRAST:  10mL GADAVIST GADOBUTROL 1 MMOL/ML IV SOLN COMPARISON:  No prior MRI available, correlation is made with 07/06/2023 CT head FINDINGS: MRI HEAD FINDINGS Brain: No restricted diffusion to suggest acute or subacute infarct. No abnormal parenchymal or meningeal enhancement. No T2 hyperintense lesions in the periventricular, juxtacortical, or infratentorial white matter to suggest demyelinating disease. No acute hemorrhage, mass, mass effect, or midline shift. No hydrocephalus or extra-axial collection. Normal cerebral volume. Craniocervical junction within normal limits. The pituitary is diminutive. No hemosiderin deposition to suggest remote hemorrhage. Vascular: Normal arterial flow voids. Narrowing of the distal transverse sinuses near the transverse-sigmoid junction. Otherwise normal arterial and venous enhancement. Skull and upper cervical spine: Normal marrow signal. Other: The mastoids are well aerated. MRI ORBITS FINDINGS Orbits: No traumatic or inflammatory finding. The optic nerves are minimally tortuous. Globes, optic nerves, orbital fat, extraocular muscles, vascular structures, and lacrimal glands are otherwise normal. Visualized sinuses: Mild mucosal thickening in the ethmoid air cells. No evidence of acute sinusitis. Soft tissues: Negative. MR VENOGRAM HEAD FINDINGS There is no evidence of dural venous sinus or deep cerebral vein thrombosis. Narrowing of the distal transverse sinuses near the transverse-sigmoid junction. IMPRESSION: 1. No acute intracranial process. No evidence of acute infarct or demyelinating disease. 2. No acute finding in the orbits. 3. No evidence of dural venous sinus or deep cerebral vein thrombosis. 4. Narrowing of the distal transverse sinuses near the transverse-sigmoid junction, which can be seen in the setting of idiopathic  intracranial hypertension. Electronically Signed   By: Wiliam Ke M.D.   On: 07/06/2023 19:36   MR Venogram Head Result Date: 07/06/2023 CLINICAL DATA:  Intermittent headaches and blurry vision, elevated pressure in eyes, concern for multiple sclerosis or brain tumor. EXAM: MRI HEAD AND ORBITS WITHOUT AND WITH CONTRAST MR VENOGRAM HEAD WITHOUT AND WITH CONTRAST TECHNIQUE: Multiplanar, multiecho pulse sequences of the brain and surrounding structures were obtained without and with intravenous contrast. Multiplanar, multiecho pulse sequences of the orbits and surrounding structures were obtained including fat saturation techniques, before and after intravenous contrast administration. Angiographic images of the intracranial venous structures were acquired using MRV technique  without intravenous contrast and using reconstructions from the MRI head postcontrast sequences. CONTRAST:  10mL GADAVIST GADOBUTROL 1 MMOL/ML IV SOLN COMPARISON:  No prior MRI available, correlation is made with 07/06/2023 CT head FINDINGS: MRI HEAD FINDINGS Brain: No restricted diffusion to suggest acute or subacute infarct. No abnormal parenchymal or meningeal enhancement. No T2 hyperintense lesions in the periventricular, juxtacortical, or infratentorial white matter to suggest demyelinating disease. No acute hemorrhage, mass, mass effect, or midline shift. No hydrocephalus or extra-axial collection. Normal cerebral volume. Craniocervical junction within normal limits. The pituitary is diminutive. No hemosiderin deposition to suggest remote hemorrhage. Vascular: Normal arterial flow voids. Narrowing of the distal transverse sinuses near the transverse-sigmoid junction. Otherwise normal arterial and venous enhancement. Skull and upper cervical spine: Normal marrow signal. Other: The mastoids are well aerated. MRI ORBITS FINDINGS Orbits: No traumatic or inflammatory finding. The optic nerves are minimally tortuous. Globes, optic nerves,  orbital fat, extraocular muscles, vascular structures, and lacrimal glands are otherwise normal. Visualized sinuses: Mild mucosal thickening in the ethmoid air cells. No evidence of acute sinusitis. Soft tissues: Negative. MR VENOGRAM HEAD FINDINGS There is no evidence of dural venous sinus or deep cerebral vein thrombosis. Narrowing of the distal transverse sinuses near the transverse-sigmoid junction. IMPRESSION: 1. No acute intracranial process. No evidence of acute infarct or demyelinating disease. 2. No acute finding in the orbits. 3. No evidence of dural venous sinus or deep cerebral vein thrombosis. 4. Narrowing of the distal transverse sinuses near the transverse-sigmoid junction, which can be seen in the setting of idiopathic intracranial hypertension. Electronically Signed   By: Wiliam Ke M.D.   On: 07/06/2023 19:36   MR ORBITS W WO CONTRAST Result Date: 07/06/2023 CLINICAL DATA:  Intermittent headaches and blurry vision, elevated pressure in eyes, concern for multiple sclerosis or brain tumor. EXAM: MRI HEAD AND ORBITS WITHOUT AND WITH CONTRAST MR VENOGRAM HEAD WITHOUT AND WITH CONTRAST TECHNIQUE: Multiplanar, multiecho pulse sequences of the brain and surrounding structures were obtained without and with intravenous contrast. Multiplanar, multiecho pulse sequences of the orbits and surrounding structures were obtained including fat saturation techniques, before and after intravenous contrast administration. Angiographic images of the intracranial venous structures were acquired using MRV technique without intravenous contrast and using reconstructions from the MRI head postcontrast sequences. CONTRAST:  10mL GADAVIST GADOBUTROL 1 MMOL/ML IV SOLN COMPARISON:  No prior MRI available, correlation is made with 07/06/2023 CT head FINDINGS: MRI HEAD FINDINGS Brain: No restricted diffusion to suggest acute or subacute infarct. No abnormal parenchymal or meningeal enhancement. No T2 hyperintense lesions  in the periventricular, juxtacortical, or infratentorial white matter to suggest demyelinating disease. No acute hemorrhage, mass, mass effect, or midline shift. No hydrocephalus or extra-axial collection. Normal cerebral volume. Craniocervical junction within normal limits. The pituitary is diminutive. No hemosiderin deposition to suggest remote hemorrhage. Vascular: Normal arterial flow voids. Narrowing of the distal transverse sinuses near the transverse-sigmoid junction. Otherwise normal arterial and venous enhancement. Skull and upper cervical spine: Normal marrow signal. Other: The mastoids are well aerated. MRI ORBITS FINDINGS Orbits: No traumatic or inflammatory finding. The optic nerves are minimally tortuous. Globes, optic nerves, orbital fat, extraocular muscles, vascular structures, and lacrimal glands are otherwise normal. Visualized sinuses: Mild mucosal thickening in the ethmoid air cells. No evidence of acute sinusitis. Soft tissues: Negative. MR VENOGRAM HEAD FINDINGS There is no evidence of dural venous sinus or deep cerebral vein thrombosis. Narrowing of the distal transverse sinuses near the transverse-sigmoid junction. IMPRESSION: 1. No acute intracranial process.  No evidence of acute infarct or demyelinating disease. 2. No acute finding in the orbits. 3. No evidence of dural venous sinus or deep cerebral vein thrombosis. 4. Narrowing of the distal transverse sinuses near the transverse-sigmoid junction, which can be seen in the setting of idiopathic intracranial hypertension. Electronically Signed   By: Wiliam Ke M.D.   On: 07/06/2023 19:36   CT Head Wo Contrast Result Date: 07/06/2023 CLINICAL DATA:  Elevated pressure in her on 5, concern for MS or brain tumor, intermittent headaches and blurred vision EXAM: CT HEAD WITHOUT CONTRAST TECHNIQUE: Contiguous axial images were obtained from the base of the skull through the vertex without intravenous contrast. RADIATION DOSE REDUCTION: This  exam was performed according to the departmental dose-optimization program which includes automated exposure control, adjustment of the mA and/or kV according to patient size and/or use of iterative reconstruction technique. COMPARISON:  04/14/2022 FINDINGS: Brain: No evidence of acute infarction, hemorrhage, mass, mass effect, or midline shift. No hydrocephalus or extra-axial fluid collection. Partial empty sella. Normal craniocervical junction. Vascular: No hyperdense vessel. Skull: Negative for fracture or focal lesion. Sinuses/Orbits: Mild mucosal thickening in the ethmoid air cells. Otherwise clear paranasal sinuses. No acute finding in the orbits. Other: The mastoid air cells are well aerated. IMPRESSION: 1. No acute intracranial process. 2. Partial empty sella, which is nonspecific but can be seen in the setting of idiopathic intracranial hypertension. Electronically Signed   By: Wiliam Ke M.D.   On: 07/06/2023 13:20    Procedures Procedures    Medications Ordered in ED Medications  gadobutrol (GADAVIST) 1 MMOL/ML injection 10 mL (10 mLs Intravenous Contrast Given 07/06/23 1828)    ED Course/ Medical Decision Making/ A&P                                 Medical Decision Making Amount and/or Complexity of Data Reviewed Labs: ordered. Radiology: ordered.  Risk Prescription drug management.   This patient presents to the ED for concern of abnormal eye exam, this involves an extensive number of treatment options, and is a complaint that carries with it a high risk of complications and morbidity.  The differential diagnosis includes MS, pseudotumor cerebri, CNS tumor, hypertension,    Co morbidities that complicate the patient evaluation  See HPI   Additional history obtained:  Additional history obtained from EMR External records from outside source obtained and reviewed including hospital records   Lab Tests:  I Ordered, and personally interpreted labs.  The pertinent  results include: No leukocytosis.  Polycythemia hemoglobin 15.1.  Placed within range.  Urine pregnancy negative.  No acute abnormalities.  No renal dysfunction.   Imaging Studies ordered:  I ordered imaging studies including MRI brain, venogram, orbits, CT head I independently visualized and interpreted imaging which showed  CT head: No acute intracranial abnormality.  Partially of the sella turcica. MRIs: Pending patient is I agree with the radiologist interpretation   Cardiac Monitoring: / EKG:  The patient was maintained on a cardiac monitor.  I personally viewed and interpreted the cardiac monitored which showed an underlying rhythm of: Sinus rhythm   Consultations Obtained:  I requested consultation with attending physician Dr. Wallace Cullens he was agreement treatment plan going forward   Problem List / ED Course / Critical interventions / Medication management  Abnormal eye exam, headache I ordered medication including fluorescein, tetracaine  Reevaluation of the patient after these medicines showed that the patient  stayed the same I have reviewed the patients home medicines and have made adjustments as needed   Social Determinants of Health:  Chronic cigarette use.  Denies illicit substance use.   Test / Admission - Considered:  Abnormal eye exam, headache Vitals signs significant for hypertension blood pressure 152/78. Otherwise within normal range and stable throughout visit. Laboratory/imaging studies significant for: See above 33 year old female presents emergency department with complaints of abnormal eye exam by ophthalmologist.  Patient states that she has been dealing with intermittent symptoms of right sided darkened vision, spots in her right vision as well as dizzy sensation. States that ever since she got the COVID-vaccine, has had 2 episodes where the right half of her right sided vision would go black. She is a prior to experiencing this dark in vision, would see  waves in both of her eyes. States that she thought it was related to an ocular headache; symptoms would resolve over a minute or 2. Patient reports more recently experiencing dizzy sensation as if she is going to pass out, uncontrollable eye movements described as "nystagmus". States that during these episodes, also has weakness bilaterally in her arms and legs. States that they similarly occur for 1 to 3 minutes before spontaneously resolving. When she first noticed the symptoms, it was when she stood up from a seated position. States that she has been mainly when she is driving now. States that they can be somewhat infrequent in nature but sometimes occurring twice a day. 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 Patient states she currently is without any visual symptoms, weakness/sensory deficits in upper lower extremities, slurred speech, facial droop. States that she is having a slight headache in the forehead region ever since her eyes were dilated but otherwise feels at baseline.  Patient with nonfocal neuroexam.  No obvious uptake on fluorescein stain.  IOP is normal.  CT imaging negative for any acute process but with partially empty sella turcica.  Per ophthalmologist, patient with left greater than right disc edema concerning for IIH versus CNS tumor versus a mass versus other pathology.  Given that MRI is not available at this facility, will transfer per recommendation for MRIs of patient's brain, orbits for further assessment of potential etiologies of symptoms.  She will plan discussed at length with patient and she acknowledged understanding.  Elected to go POV given potential exuberant costs of ambulance fees.  Patient currently with only minor frontal headache that began after her eyes were dilated that has has subsequently improved so she  seems stable enough for POV transfer.        Final Clinical Impression(s) / ED Diagnoses Final diagnoses:  Nonintractable headache, unspecified chronicity pattern, unspecified headache type  Blurred vision    Rx / DC Orders ED Discharge Orders          Ordered    Ambulatory referral to Neurology       Comments: An appointment is requested in approximately: 1 week Chronic headaches, vision loss, papilledema - pseudotumor evaluation   07/06/23 2042    acetaZOLAMIDE (DIAMOX) 125 MG tablet  2 times daily        07/06/23 2043              Peter Garter, [place] 07/07/23 0942    Franne Forts, DO 07/11/23 [phone]

## 2023-07-06 NOTE — ED Notes (Signed)
 Patient transported to MRI

## 2023-07-06 NOTE — Discharge Instructions (Addendum)
 You will need follow-up with a neurologist for your headaches and blurred vision.  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  I include information to read over.  This can cause increased pressure on the brain, which can cause swelling of your optic nerve, but sometimes blurred vision.  This is diagnosed and treated through a lumbar puncture or spinal tap.  Your neurologist can help arrange for this if they feel that this is indicated.  In the meantime, I started you on Diamox , which is a medication that can help reduce some of the extra fluid on the brain.  If you develop worsening headache, particularly with blurred vision or loss of vision, please return to the emergency department.  These may be signs of an emergent issue that needs treatment.

## 2023-07-07 ENCOUNTER — Encounter: Payer: Self-pay | Admitting: Neurology

## 2023-07-08 NOTE — Progress Notes (Unsigned)
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  Will also check CSF cell count, protein, glucose, cytology and gram stain/culture. Pending results of opening pressure, would restart acetazolamide 500mg  twice daily and get repeat eye exam in 6-8 weeks Follow up 6 months.   Subjective:  Sara Vargas is a 33 year old right-handed female with HTN, depression and anxiety who presents for headache and blurred vision.  History supplemented by ED note.  CT Head, MRI/MRV of head and orbits personally reviewed.  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  She was sent to the ED where CT head showed partial empty sella but no acute findings.  MRI of brain/orbits and MRV of head with and without contrast showed narrowing of the distal transverse sinuses near the transverse-sigmoid junction but no dural venous sinus thrombosis or other acute intracranial abnormality.  Patient declined LP.    In hindsight, she reports an isolated episode in late 2020 of vision loss in the right eye which lasted a few minutes.  Thought it may have been an ocular migraine and attributed it to getting the COVID shot at that time, but has no history of migraines.  Over the past year, she has had brief episodes of dizziness.  First time, it occurred upon standing up.  She has since had episodes at rest, including while driving, lasting a minute.  She sometimes sees sees transparent/gray spots in the vision of both eyes.  Denies history of headaches but she says she is told that she does complain that her  head bothers her.  However, since the ED visit, she does reports bi-temporal pressure headaches now.  On occasion, she reports hearing whooshing in her head.  Patient is morbidly obese but has been losing weight over the past year.    PAST MEDICAL HISTORY: Past Medical History:  Diagnosis Date   Anxiety    Depression    Hypertension    labetolol 200 bid    PAST SURGICAL HISTORY: Past Surgical History:  Procedure Laterality Date   arm surgery     CESAREAN SECTION N/A 08/28/2015   Procedure: CESAREAN SECTION;  Surgeon: Tereso Newcomer, MD;  Location: [facility];  Service: Obstetrics;  Laterality: N/A;   TONSILLECTOMY     URETERAL EXPLORATION      MEDICATIONS: Current Outpatient Medications on File Prior to Visit  Medication Sig Dispense Refill   acetaZOLAMIDE (DIAMOX) 125 MG tablet Take 4 tablets (500 mg total) by mouth 2 (two) times daily. 240 tablet 0   amLODipine (NORVASC) 5 MG tablet Take 1 tablet (5 mg total) by mouth daily. (Patient taking differently: Take 5 mg by mouth at bedtime. ) 30 tablet 6   benzonatate (TESSALON) 100 MG capsule Take 1 capsule (100 mg total) by mouth every 8 (eight) hours. 21 capsule 0   clindamycin (CLEOCIN T) 1 % external solution Apply topically twice a day as directed. 60 mL 2   clonazePAM (KLONOPIN) 1 MG tablet Take 1 mg by mouth 3 (three) times daily as needed for anxiety.     cyclobenzaprine (FLEXERIL) 5 MG tablet Take 1  tablet (5 mg total) by mouth 3 (three) times daily as needed for muscle spasms. 15 tablet 0   HYDROcodone-acetaminophen (NORCO/VICODIN) 5-325 MG tablet Take 1-2 tablets by mouth every 6 (six) hours as needed for severe pain. 16 tablet 0   nitrofurantoin, macrocrystal-monohydrate, (MACROBID) 100 MG capsule TAKE 1 capsule BY MOUTH twice a day FOR 5 days 10 capsule 0   predniSONE (DELTASONE) 20 MG tablet Take 2 tablets (40 mg total) by mouth daily. 10 tablet 0   Prenatal Vit-Fe Fumarate-FA (MULTIVITAMIN-PRENATAL) 27-0.8 MG TABS tablet  Take 1 tablet by mouth daily at 12 noon. (Patient taking differently: Take 1 tablet by mouth at bedtime.) 30 each 11   promethazine-dextromethorphan (PROMETHAZINE-DM) 6.25-15 MG/5ML syrup Take 5 mLs by mouth 4 (four) times daily as needed for cough. 118 mL 0   Semaglutide-Weight Management (WEGOVY) 1 MG/0.5ML SOAJ Inject 1 mg (0.66ml)  into the skin once a week. 2 mL 0   Semaglutide-Weight Management (WEGOVY) 1.7 MG/0.75ML SOAJ Inject 1.7 mg into the skin once a week. 3 mL 0   Semaglutide-Weight Management (WEGOVY) 2.4 MG/0.75ML SOAJ Inject 2.4 mg into the skin once a week. 3 mL 0   Semaglutide-Weight Management 0.5 MG/0.5ML SOAJ Inject 0.5 mg into the skin once a week. 2 mL 0   sertraline (ZOLOFT) 100 MG tablet Take 100 mg by mouth daily.     venlafaxine XR (EFFEXOR-XR) 37.5 MG 24 hr capsule Take 37.5 mg by mouth daily with breakfast.     No current facility-administered medications on file prior to visit.    ALLERGIES: No Known Allergies  FAMILY HISTORY: Family History  Problem Relation Age of Onset   Heart disease Mother    COPD Mother    Asthma Mother    Other Father        benign brain tumor   Heart disease Maternal Grandmother     Objective:  Blood pressure 127/80, pulse 95, height 5\' 5"  (1.651 m), weight (!) 306 lb (138.8 kg), SpO2 97%, currently breastfeeding. General: No acute distress.  Patient appears well-groomed.   Head:  Normocephalic/atraumatic Eyes:  fundi examined but not visualized Neck: supple, no paraspinal tenderness, full range of motion Heart: regular rate and rhythm Neurological Exam: Mental status: alert and oriented to person, place, and time, speech fluent and not dysarthric, language intact. Cranial nerves: CN I: not tested CN II: pupils equal, round and reactive to light, visual fields intact CN III, IV, VI:  full range of motion, no nystagmus, no ptosis CN V: facial sensation intact. CN VII: upper and lower face symmetric CN VIII: hearing  intact CN IX, X: gag intact, uvula midline CN XI: sternocleidomastoid and trapezius muscles intact CN XII: tongue midline Bulk & Tone: normal, no fasciculations. Motor:  muscle strength 5/5 throughout Sensation:  Pinprick and vibratory sensation intact. Deep Tendon Reflexes:  2+ throughout,  toes downgoing.   Finger to nose testing:  Without dysmetria.    Gait:  Normal station and stride.  Romberg negative.    Thank you for allowing me to take part in the care of this patient.  Shon Millet, DO  CC: Doreen Beam, MD

## 2023-07-09 ENCOUNTER — Ambulatory Visit (INDEPENDENT_AMBULATORY_CARE_PROVIDER_SITE_OTHER): Payer: No Typology Code available for payment source | Admitting: Neurology

## 2023-07-09 ENCOUNTER — Encounter: Payer: Self-pay | Admitting: Neurology

## 2023-07-09 VITALS — BP 127/80 | HR 95 | Ht 65.0 in | Wt 306.0 lb

## 2023-07-09 DIAGNOSIS — H4710 Unspecified papilledema: Secondary | ICD-10-CM

## 2023-07-09 NOTE — Patient Instructions (Signed)
Schedule for spinal tap.  If pressure is elevated, I will have you start the acetazolamide and then get a repeat eye exam in 6-8 weeks.

## 2023-07-16 ENCOUNTER — Ambulatory Visit: Payer: No Typology Code available for payment source | Admitting: Neurology

## 2023-07-21 NOTE — Discharge Instructions (Signed)

## 2023-07-22 ENCOUNTER — Encounter: Payer: Self-pay | Admitting: Neurology

## 2023-07-22 ENCOUNTER — Ambulatory Visit
Admission: RE | Admit: 2023-07-22 | Discharge: 2023-07-22 | Disposition: A | Discharge status: Home / Self Care | Payer: No Typology Code available for payment source | Source: Ambulatory Visit | Attending: Neurology | Admitting: Neurology

## 2023-07-22 ENCOUNTER — Other Ambulatory Visit (HOSPITAL_COMMUNITY)
Admission: RE | Admit: 2023-07-22 | Discharge: 2023-07-22 | Disposition: A | Discharge status: Home / Self Care | Payer: No Typology Code available for payment source | Source: Ambulatory Visit | Attending: Neurology | Admitting: Neurology

## 2023-07-22 VITALS — BP 128/75 | HR 84

## 2023-07-22 DIAGNOSIS — H4710 Unspecified papilledema: Secondary | ICD-10-CM | POA: Insufficient documentation

## 2023-07-22 NOTE — Progress Notes (Signed)
 1 vial of blood drawn from pts LFA to be sent off with LP lab work. 1 successful attempt, pt tolerated well. Gauze and tape applied after.

## 2023-07-23 LAB — CYTOLOGY - NON PAP

## 2023-07-29 LAB — CSF CULTURE W GRAM STAIN
MICRO NUMBER:: 16131488
Result:: NO GROWTH
SPECIMEN QUALITY:: ADEQUATE

## 2023-07-29 LAB — CNS IGG SYNTHESIS RATE, CSF+BLOOD
Albumin Serum: 4.2 g/dL (ref 3.6–5.1)
Albumin, CSF: 11.8 mg/dL (ref 8.0–42.0)
IgG (Immunoglobin G), Serum: 744 mg/dL (ref 600–1640)
IgG (Immunoglobin G), Serum: 744 mg/dL (ref 600–3.3)
IgG Total CSF: 1.2 mg/dL (ref 0.8–7.7)
IgG-Index: 4.2 g/dL (ref 3.6–5.1)

## 2023-07-29 LAB — GLUCOSE, CSF: Glucose, CSF: 59 mg/dL (ref 40–80)

## 2023-07-29 LAB — CSF CELL COUNT WITH DIFFERENTIAL
RBC Count, CSF: 89 {cells}/uL — ABNORMAL HIGH
TOTAL NUCLEATED CELL: 1 {cells}/uL (ref 0–5)

## 2023-07-29 LAB — MYELIN BASIC PROTEIN, CSF: Myelin Basic Protein: <2 ug/L (ref <=4.0)

## 2023-07-29 LAB — PROTEIN, CSF: Total Protein, CSF: 27 mg/dL (ref 15–45)

## 2023-07-31 ENCOUNTER — Ambulatory Visit (INDEPENDENT_AMBULATORY_CARE_PROVIDER_SITE_OTHER): Admitting: Neurology

## 2023-07-31 ENCOUNTER — Encounter: Payer: Self-pay | Admitting: Neurology

## 2023-07-31 VITALS — BP 126/69 | HR 81 | Ht 65.0 in | Wt 304.0 lb

## 2023-07-31 DIAGNOSIS — H579 Unspecified disorder of eye and adnexa: Secondary | ICD-10-CM

## 2023-07-31 NOTE — Progress Notes (Signed)
 NEUROLOGY FOLLOW UP OFFICE NOTE  Sara Vargas [id number]  Assessment/Plan:   Questionable papilledema on ophthalmology exam.  While imaging shows partial empty sella and narrowing of the transverse sinuses which may be seen in IIH, these are ultimately nonspecific findings.  The opening CSF pressure is normal, making IIH unlikely.  No findings of optic nerve abnormalities.  Based on the report by ophthalmology, it appears that the findings were mild/negligible, raising possibility of overcall.  She has a follow up appointment with her ophthalmologist, Dr. Sherryll Burger, on Monday.  Will see what repeat exam shows.  I also left a message with his assistant for call back to discuss further.  Total time spent in chart and face to face with patient:  36 minutes   Subjective:  Sara Vargas is a 33 year old right-handed female with HTN, depression and anxiety who follows up for papilledema.    UPDATE: Underwent LP on 2/26 which revealed normal opening pressure of 16 cm water.    HISTORY: On 07/06/2023, she was evaluated by Dr. Sherryll Burger of ophthalmology for routine at [facility] (never before had a formal eye exam) and noted to asymmetry of the optic nerves concerning for possible left optic disc edema.  She was sent to the ED where CT head showed partial empty sella but no acute findings.  MRI of brain/orbits and MRV of head with and without contrast showed narrowing of the distal transverse sinuses near the transverse-sigmoid junction but no dural venous sinus thrombosis or other acute intracranial abnormality.  Patient declined LP.    In hindsight, she reports an isolated episode in late 2020 of vision loss in the right eye which lasted a few minutes.  Thought it may have been an ocular migraine and attributed it to getting the COVID shot at that time, but has no history of migraines.  Over the past year, she has had brief episodes of dizziness.  First time, it occurred upon standing up.   She has since had episodes at rest, including while driving, lasting a minute.  She sometimes sees sees transparent/gray spots in the vision of both eyes.  Denies history of headaches but she says she is told that she does complain that her head bothers her.  However, since the ED visit, she does reports bi-temporal pressure headaches now.  On occasion, she reports hearing whooshing in her head.  Patient is morbidly obese but has been losing weight over the past year.  PAST MEDICAL HISTORY: Past Medical History:  Diagnosis Date   Anxiety    Depression    Hypertension    labetolol 200 bid    MEDICATIONS: Current Outpatient Medications on File Prior to Visit  Medication Sig Dispense Refill   acetaZOLAMIDE (DIAMOX) 125 MG tablet Take 4 tablets (500 mg total) by mouth 2 (two) times daily. (Patient not taking: Reported on 07/09/2023) 240 tablet 0   amLODipine (NORVASC) 5 MG tablet Take 1 tablet (5 mg total) by mouth daily. (Patient taking differently: Take 5 mg by mouth at bedtime.) 30 tablet 6   clindamycin (CLEOCIN T) 1 % external solution Apply topically twice a day as directed. 60 mL 2   clonazePAM (KLONOPIN) 1 MG tablet Take 1 mg by mouth 3 (three) times daily as needed for anxiety.     norethindrone (MICRONOR) 0.35 MG tablet Take 1 tablet by mouth daily.     Semaglutide-Weight Management (WEGOVY) 2.4 MG/0.75ML SOAJ Inject 2.4 mg into the skin once a week. 3  mL 0   venlafaxine XR (EFFEXOR-XR) 75 MG 24 hr capsule Take 75 mg by mouth daily.     No current facility-administered medications on file prior to visit.    ALLERGIES: No Known Allergies  FAMILY HISTORY: Family History  Problem Relation Age of Onset   Stroke Mother    Heart disease Mother    COPD Mother    Asthma Mother    Other Father        benign brain tumor   Migraines Maternal Grandmother    Heart disease Maternal Grandmother    Migraines Cousin       Objective:  Blood pressure 126/69, pulse 81, height 5\' 5"   (1.651 m), weight (!) 304 lb (137.9 kg), SpO2 98%, currently breastfeeding. General: No acute distress.  Patient appears well-groomed.     Shon Millet, DO  CC: Doreen Beam, MD

## 2023-08-19 ENCOUNTER — Ambulatory Visit: Payer: No Typology Code available for payment source | Admitting: Neurology

## 2023-08-24 ENCOUNTER — Encounter: Payer: Self-pay | Admitting: Neurology

## 2024-01-06 ENCOUNTER — Ambulatory Visit: Payer: No Typology Code available for payment source | Admitting: Neurology

## 2024-04-11 ENCOUNTER — Other Ambulatory Visit: Payer: Self-pay | Admitting: *Deleted

## 2024-04-11 DIAGNOSIS — L02214 Cutaneous abscess of groin: Secondary | ICD-10-CM

## 2024-04-14 ENCOUNTER — Ambulatory Visit: Admitting: General Surgery

## 2024-04-19 ENCOUNTER — Encounter: Payer: Self-pay | Admitting: General Surgery

## 2024-12-13 ENCOUNTER — Ambulatory Visit: Admitting: Physician Assistant
